# Patient Record
Sex: Male | Born: 2000 | Race: White | Hispanic: No | Marital: Single | Smoking: Never smoker
Health system: Southern US, Community
[De-identification: ages and names within clinical notes are randomized; demographics above are authoritative.]

## PROBLEM LIST (undated history)

## (undated) DIAGNOSIS — G809 Cerebral palsy, unspecified: Secondary | ICD-10-CM

## (undated) DIAGNOSIS — R451 Restlessness and agitation: Secondary | ICD-10-CM

## (undated) DIAGNOSIS — I456 Pre-excitation syndrome: Secondary | ICD-10-CM

## (undated) DIAGNOSIS — F319 Bipolar disorder, unspecified: Secondary | ICD-10-CM

## (undated) HISTORY — PX: TONSILLECTOMY: SUR1361

## (undated) HISTORY — DX: Cerebral palsy, unspecified: G80.9

## (undated) HISTORY — DX: Restlessness and agitation: R45.1

---

## 2021-11-28 ENCOUNTER — Emergency Department (HOSPITAL_COMMUNITY)
Admission: EM | Admit: 2021-11-28 | Discharge: 2021-12-02 | Disposition: A | Discharge status: Home / Self Care | Payer: Medicaid Other | Attending: Emergency Medicine | Admitting: Emergency Medicine

## 2021-11-28 ENCOUNTER — Other Ambulatory Visit: Payer: Self-pay

## 2021-11-28 ENCOUNTER — Encounter (HOSPITAL_COMMUNITY): Payer: Self-pay | Admitting: Emergency Medicine

## 2021-11-28 DIAGNOSIS — R451 Restlessness and agitation: Secondary | ICD-10-CM

## 2021-11-28 DIAGNOSIS — J320 Chronic maxillary sinusitis: Secondary | ICD-10-CM | POA: Insufficient documentation

## 2021-11-28 DIAGNOSIS — J323 Chronic sphenoidal sinusitis: Secondary | ICD-10-CM | POA: Insufficient documentation

## 2021-11-28 DIAGNOSIS — F3113 Bipolar disorder, current episode manic without psychotic features, severe: Secondary | ICD-10-CM | POA: Diagnosis not present

## 2021-11-28 DIAGNOSIS — F3112 Bipolar disorder, current episode manic without psychotic features, moderate: Secondary | ICD-10-CM

## 2021-11-28 DIAGNOSIS — J322 Chronic ethmoidal sinusitis: Secondary | ICD-10-CM | POA: Insufficient documentation

## 2021-11-28 DIAGNOSIS — Z20822 Contact with and (suspected) exposure to covid-19: Secondary | ICD-10-CM | POA: Insufficient documentation

## 2021-11-28 DIAGNOSIS — Z79899 Other long term (current) drug therapy: Secondary | ICD-10-CM | POA: Insufficient documentation

## 2021-11-28 DIAGNOSIS — R441 Visual hallucinations: Secondary | ICD-10-CM | POA: Diagnosis present

## 2021-11-28 DIAGNOSIS — R4182 Altered mental status, unspecified: Secondary | ICD-10-CM | POA: Insufficient documentation

## 2021-11-28 DIAGNOSIS — F319 Bipolar disorder, unspecified: Secondary | ICD-10-CM

## 2021-11-28 HISTORY — DX: Bipolar disorder, unspecified: F31.9

## 2021-11-28 LAB — COMPREHENSIVE METABOLIC PANEL
ALT: 18 U/L (ref 0–44)
AST: 18 U/L (ref 15–41)
Albumin: 4.5 g/dL (ref 3.5–5.0)
Alkaline Phosphatase: 51 U/L (ref 38–126)
Anion gap: 12 (ref 5–15)
BUN: 12 mg/dL (ref 6–20)
CO2: 23 mmol/L (ref 22–32)
Calcium: 9.3 mg/dL (ref 8.9–10.3)
Chloride: 105 mmol/L (ref 98–111)
Creatinine, Ser: 0.83 mg/dL (ref 0.61–1.24)
GFR, Estimated: >60 mL/min (ref >60)
Glucose, Bld: 106 mg/dL — ABNORMAL HIGH (ref 70–99)
Potassium: 3.5 mmol/L (ref 3.5–5.1)
Sodium: 140 mmol/L (ref 135–145)
Total Bilirubin: 0.6 mg/dL (ref 0.3–1.2)
Total Protein: 7.7 g/dL (ref 6.5–8.1)

## 2021-11-28 LAB — CBC
HCT: 42.6 % (ref 39.0–52.0)
Hemoglobin: 13.7 g/dL (ref 13.0–17.0)
MCH: 29.5 pg (ref 26.0–34.0)
MCHC: 32.2 g/dL (ref 30.0–36.0)
MCV: 91.8 fL (ref 80.0–100.0)
Platelets: 246 10*3/uL (ref 150–400)
RBC: 4.64 MIL/uL (ref 4.22–5.81)
RDW: 11.7 % (ref 11.5–15.5)
WBC: 6.5 10*3/uL (ref 4.0–10.5)
nRBC: 0 % (ref 0.0–0.2)

## 2021-11-28 LAB — ACETAMINOPHEN LEVEL: Acetaminophen (Tylenol), Serum: <10 ug/mL — ABNORMAL LOW (ref 10–30)

## 2021-11-28 LAB — SALICYLATE LEVEL: Salicylate Lvl: <7 mg/dL — ABNORMAL LOW (ref 7.0–30.0)

## 2021-11-28 LAB — ETHANOL: Alcohol, Ethyl (B): <10 mg/dL (ref <10)

## 2021-11-28 MED ORDER — LORAZEPAM 1 MG PO TABS
1.0000 mg | ORAL_TABLET | ORAL | Status: AC | PRN
  Administered 2021-11-28: 1 mg via ORAL
  Filled 2021-11-28: qty 1

## 2021-11-28 MED ORDER — HALOPERIDOL 5 MG PO TABS: 10.0000 mg | ORAL_TABLET | Freq: Once | ORAL | Status: DC

## 2021-11-28 MED ORDER — ZIPRASIDONE HCL 20 MG PO CAPS
20.0000 mg | ORAL_CAPSULE | Freq: Once | ORAL | Status: AC
  Administered 2021-11-28: 20 mg via ORAL
  Filled 2021-11-28: qty 1

## 2021-11-28 MED ORDER — RISPERIDONE 1 MG PO TBDP
2.0000 mg | ORAL_TABLET | Freq: Three times a day (TID) | ORAL | Status: DC | PRN
  Administered 2021-11-30 – 2021-12-02 (×2): 2 mg via ORAL
  Filled 2021-11-28 (×4): qty 2

## 2021-11-28 MED ORDER — ZIPRASIDONE MESYLATE 20 MG IM SOLR: 20.0000 mg | INTRAMUSCULAR | Status: DC | PRN

## 2021-11-28 MED ORDER — LORAZEPAM 2 MG/ML IJ SOLN
1.0000 mg | Freq: Once | INTRAMUSCULAR | Status: AC
  Administered 2021-11-28: 1 mg via INTRAMUSCULAR
  Filled 2021-11-28: qty 1

## 2021-11-28 MED ORDER — HALOPERIDOL LACTATE 5 MG/ML IJ SOLN: 10.0000 mg | Freq: Once | INTRAMUSCULAR | Status: DC

## 2021-11-28 NOTE — ED Notes (Signed)
Pt ambulated to bathroom by mother and family friend. Pt attempted to provide urine specimen, but was unable to urinate at this time.

## 2021-11-28 NOTE — BH Assessment (Signed)
Received TTS consult. Spoke with RN who will contact TTS when interpreter and tele-cart are ready.   Pamalee Leyden, [facility], [facility] Triage Specialist [phone]
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
## 2021-11-28 NOTE — ED Notes (Signed)
Difficult to obtain VS on pt d/t behavior

## 2021-11-28 NOTE — ED Notes (Addendum)
Pt is a refugee from [place] and has been here about a month. 2 days ago, his psychiatrist took pt off of all of his meds per family. Pt has not slept in 3 days. 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 Pt has intermittent loud speech w/ flight of ideas, labile from happy to sad. Pt has auditory and visual hallucinations per family. Voices talk to him in [place] and Arabic per family. Pt w/ intermittent outbursts of activity, almost jumping off of bed. Family very supportive and able to help redirect pt.
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
## 2021-11-28 NOTE — BH Assessment (Addendum)
Comprehensive Clinical Assessment (CCA) Note  11/28/2021 Allyn Weinfeld PZ:[id number]  DISPOSITION: Gave clinical report to Quintella Reichert, NP who recommended Pt be observed overnight and evaluated by psychiatry in the morning. She also recommended Pt be given Seroquel 150 mg tonight and recommends a Depakote level and a neurology consult. Notified Dr. Carmin Muskrat and Newman Pies, RN of recommendations via secure message.  The patient demonstrates the following risk factors for suicide: Chronic risk factors for suicide include: psychiatric disorder of bipolar disorder . Acute risk factors for suicide include:  immigration to [place] from [place] . Protective factors for this patient include: positive social support and positive therapeutic relationship. Considering these factors, the overall suicide risk at this point appears to be low. Patient is appropriate for outpatient follow up.  [place] ED from 11/28/2021 in [place] No Risk      Pt is a 20 year old single male who presents to [facility] accompanied by his parents, who participated in assessment. Pt and family speak Arabic and assessment was conducted with online translation service. 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 Pt was unable to appropriately answer most questions and groaned and yelled during assessment. He was being physically supported by an Therapist, sports because Pt wanted to flail his arms and legs. Obtaining information from Pt directly was very difficult due to Pt's mental status, behavior, and language barrier.  Pt's mother reports Pt was stable for years and three years ago he had mood lability and was diagnosed with bipolar disorder. She describes recent mood cycling where Pt will be depressed and lethargic for approximately two weeks then manic for  approximately two weeks. She says he is currently having a psychotic episode, the worst he has experienced. She and Pt's father report Pt has been agitated, loud, swinging his arms and legs, experiencing flight of ideas, saying things that do not make sense, and appears to be experiencing auditory and visual hallucinations. Per parents, he hears voices talking to him in [place] and Arabic. Mother says Pt has not slept in three days. Parents report he does not have self-harm behaviors. They confirm there is no possibility of alcohol or drug use.  Pt and family immigrated from [place] to [place], [place] on 10/21/2021. They are living in a residence with two other families. Pt was receiving outpatient psychiatric medication management in [place]. He saw a psychiatrist in [place], Dr. Arnold Long, two days ago. Mother reports Dr. Rolena Infante changed two of Pt's medications and kept Pt on the other medications. She says medications Pt is currently taking are: Enderol, Ludasidone, Quetiazic, Depakine, and Aripiprazole. She says he does swallow the medications. Parents report he has not history of inpatient psychiatric treatment.  Pt's parents state they will agree to Pt being held overnight so he can sleep but they do not want him psychiatrically hospitalized. They say they need to be with Pt to assist him. Parents say they would rather take Pt home and give him medication than have him admitted to a psychiatric facility.  Father: Maylon Cos [phone]    Chief Complaint:  Chief Complaint  Patient presents with   Manic Behavior   Visit Diagnosis:  F31.13 Bipolar I disorder, Current or most recent episode manic, Severe   CCA Screening, Triage and Referral (STR)  Patient Reported Information How did you hear about [place]? Family/Friend  Referral name: No data recorded Referral phone number: No data recorded  Whom do you see for routine medical problems? No data recorded Practice/Facility Name: No data  recorded Practice/Facility Phone Number: No data recorded Name of Contact: No data recorded Contact Number: No data recorded Contact Fax Number: No data recorded Prescriber Name: No data recorded Prescriber Address (if known): No data recorded  What Is the Reason for Your Visit/Call Today? Per parents, Pt has a history of oxygen deficiency as an infant, brain atrophy, and bipolar disorder. She says he is having a severe manic episode with flight of ideas, insomnia, agitation, and talking to people who are not there. Pt is unable to answer most questions appropriately.  How Long Has This Been Causing You Problems? 1 wk - 1 month  What Do You Feel Would Help You the Most Today? Medication(s)   Have You Recently Been in Any Inpatient Treatment (Hospital/Detox/Crisis Center/28-Day Program)? No data recorded Name/Location of Program/Hospital:No data recorded How Long Were You There? No data recorded When Were You Discharged? No data recorded  Have You Ever Received Services From [facility] Before? No data recorded Who Do You See at [facility]? No data recorded  Have You Recently Had Any Thoughts About Hurting Yourself? No  Are You Planning to Commit Suicide/Harm Yourself At This time? No   Have you Recently Had Thoughts About [place]? No  Explanation: No data recorded  Have You Used Any Alcohol or Drugs in the Past 24 Hours? No  How Long Ago Did You Use Drugs or Alcohol? No data recorded What Did You Use and How Much? No data recorded  Do You Currently Have a Therapist/Psychiatrist? Yes  Name of Therapist/Psychiatrist: Arnold Long, MD   Have You Been Recently Discharged From Any Office Practice or Programs? No  Explanation of Discharge From Practice/Program: No data recorded    CCA Screening Triage Referral Assessment Type of Contact: Tele-Assessment  Is this Initial or Reassessment? Initial Assessment  Date Telepsych consult ordered in [facility]:   11/28/21  Time Telepsych consult ordered in [facility]:  1934   Patient Reported Information Reviewed? No data recorded Patient Left Without Being Seen? No data recorded Reason for Not Completing Assessment: No data recorded  Collateral Involvement: Pt's mother and father   Does Patient Have a Court Appointed Legal Guardian? No data recorded Name and Contact of Legal Guardian: No data recorded If Minor and Not Living with Parent(s), Who has Custody? NA  Is CPS involved or ever been involved? Never  Is APS involved or ever been involved? Never   Patient Determined To Be At Risk for Harm To Self or Others Based on Review of Patient Reported Information or Presenting Complaint? No  Method: No data recorded Availability of Means: No data recorded Intent: No data recorded Notification Required: No data recorded Additional Information for Danger to Others Potential: No data recorded Additional Comments for Danger to Others Potential: No data recorded Are There Guns or Other Weapons in Your Home? No data recorded Types of Guns/Weapons: No data recorded Are These Weapons Safely Secured?                            No data recorded Who Could Verify You Are Able To Have These Secured: No data recorded Do You Have any Outstanding Charges, Pending Court Dates, Parole/Probation? No data recorded Contacted To Inform of Risk of Harm To Self or Others: Family/Significant Other:  Location of Assessment: [facility] ED   Does Patient Present under Involuntary Commitment? No  IVC Papers Initial File Date: No data recorded  [place] of Residence: [place]   Patient Currently Receiving the Following Services: Medication Management   Determination of Need: Urgent (48 hours)   Options For Referral: Inpatient Hospitalization; Medication Management; Outpatient Therapy     CCA Biopsychosocial Intake/Chief Complaint:  No data recorded Current Symptoms/Problems: No data recorded  Patient Reported  Schizophrenia/Schizoaffective Diagnosis in Past: No   Strengths: Pt has good family support  Preferences: No data recorded Abilities: No data recorded  Type of Services Patient Feels are Needed: No data recorded  Initial Clinical Notes/Concerns: No data recorded  Mental Health Symptoms Depression:   Change in energy/activity; Difficulty Concentrating; Fatigue; Irritability; Sleep (too much or little)   Duration of Depressive symptoms:  Greater than two weeks   Mania:   Change in energy/activity; Increased Energy; Irritability; Racing thoughts; Recklessness   Anxiety:    Difficulty concentrating; Fatigue; Irritability; Restlessness; Sleep; Tension; Worrying   Psychosis:   Hallucinations; Grossly disorganized or catatonic behavior   Duration of Psychotic symptoms:  Less than six months   Trauma:   -- (Pt unable to answer due to current mental status.)   Obsessions:   -- (Pt unable to answer due to current mental status.)   Compulsions:   -- (Pt unable to answer due to current mental status.)   Inattention:   -- (Pt unable to answer due to current mental status.)   Hyperactivity/Impulsivity:   -- (Pt unable to answer due to current mental status.)   Oppositional/Defiant Behaviors:   -- (Pt unable to answer due to current mental status.)   Emotional Irregularity:   Mood lability   Other Mood/Personality Symptoms:   NA    Mental Status Exam Appearance and self-care  Stature:   Average   Weight:   Average weight   Clothing:   Casual   Grooming:   Normal   Cosmetic use:   None   Posture/gait:   Bizarre; Slumped   Motor activity:   Agitated; Restless   Sensorium  Attention:   Confused   Concentration:   Scattered   Orientation:   -- (Pt unable to answer due to current mental status.)   Recall/memory:   -- (Pt unable to answer due to current mental status.)   Affect and Mood  Affect:   Anxious   Mood:   Irritable; Anxious    Relating  Eye contact:   Fleeting   Facial expression:   Tense; Anxious   Attitude toward examiner:   Uninterested   Thought and Language  Speech flow:  Loud (Pt unable to answer due to current mental status.)   Thought content:   -- (Pt unable to answer due to current mental status.)   Preoccupation:   -- (Pt unable to answer due to current mental status.)   Hallucinations:   Other (Comment) (Pt unable to answer due to current mental status.)   Organization:  No data recorded  [employer] of Knowledge:   Impoverished by (Comment) (Cognitive impairment)   Intelligence:   Needs investigation   Abstraction:   -- (Pt unable to answer due to current mental status.)   Judgement:   -- (Pt unable to answer due to current mental status.)   Reality Testing:   -- (Pt unable to answer due to current mental status.)   Insight:   -- (Pt unable to answer due to current  mental status.)   Decision Making:   -- (Pt unable to answer due to current mental status.)   Social Functioning  Social Maturity:   -- (Pt unable to answer due to current mental status.)   Social Judgement:   -- (Pt unable to answer due to current mental status.)   Stress  Stressors:   Transitions; Housing   Coping Ability:   Exhausted; Overwhelmed   Skill Deficits:   Intellect/education   Supports:   Family     Religion: Religion/Spirituality Are You A Religious Person?:  (Pt unable to answer due to current mental status.)  Leisure/Recreation: Leisure / Recreation Do You Have Hobbies?:  (Pt unable to answer due to current mental status.)  Exercise/Diet: Exercise/Diet Do You Exercise?:  (Pt unable to answer due to current mental status.) Have You Gained or Lost A Significant Amount of Weight in the Past Six Months?: Yes-Lost Number of Pounds Lost?:  (Pt unable to answer due to current mental status.) Do You Follow a Special Diet?: No Do You Have Any Trouble Sleeping?:  Yes Explanation of Sleeping Difficulties: Parents report Pt has not slept in 3 days   CCA Employment/Education Employment/Work Situation: Employment / Work Situation Employment Situation: Unemployed Patient's Job has Been Impacted by Current Illness: No Has Patient ever Been in Passenger transport manager?: No  Education: Education Is Patient Currently Attending School?: No Did Physicist, medical?: No Did You Have An Individualized Education Program (IIEP):  (Pt unable to answer due to current mental status.) Did You Have Any Difficulty At [employer]?:  (Pt unable to answer due to current mental status.) Patient's Education Has Been Impacted by Current Illness:  (Pt unable to answer due to current mental status.)   CCA Family/Childhood History Family and Relationship History: Family history Marital status: Single Does patient have children?: No  Childhood History:  Childhood History By whom was/is the patient raised?: Both parents Did patient suffer any verbal/emotional/physical/sexual abuse as a child?:  (Pt unable to answer due to current mental status.) Did patient suffer from severe childhood neglect?:  (Pt unable to answer due to current mental status.) Has patient ever been sexually abused/assaulted/raped as an adolescent or adult?:  (Pt unable to answer due to current mental status.) Was the patient ever a victim of a crime or a disaster?:  (Pt unable to answer due to current mental status.) Witnessed domestic violence?:  (Pt unable to answer due to current mental status.) Has patient been affected by domestic violence as an adult?:  (Pt unable to answer due to current mental status.)  Child/Adolescent Assessment:     CCA Substance Use Alcohol/Drug Use: Alcohol / Drug Use Pain Medications: None Prescriptions: None Over the Counter: None History of alcohol / drug use?: No history of alcohol / drug abuse Longest period of sobriety (when/how long): NA                          ASAM's:  Six Dimensions of Multidimensional Assessment  Dimension 1:  Acute Intoxication and/or Withdrawal Potential:      Dimension 2:  Biomedical Conditions and Complications:      Dimension 3:  Emotional, Behavioral, or Cognitive Conditions and Complications:     Dimension 4:  Readiness to Change:     Dimension 5:  Relapse, Continued use, or Continued Problem Potential:     Dimension 6:  Recovery/Living Environment:     ASAM Severity Score:    ASAM Recommended Level of Treatment:  Substance use Disorder (SUD)    Recommendations for Services/Supports/Treatments:    DSM5 Diagnoses: There are no problems to display for this patient.   Patient Centered Plan: Patient is on the following Treatment Plan(s):  Anxiety   Referrals to Alternative Service(s): Referred to Alternative Service(s):   Place:   Date:   Time:    Referred to Alternative Service(s):   Place:   Date:   Time:    Referred to Alternative Service(s):   Place:   Date:   Time:    Referred to Alternative Service(s):   Place:   Date:   Time:     Evelena Peat, [facility]

## 2021-11-29 ENCOUNTER — Emergency Department (HOSPITAL_COMMUNITY): Payer: Medicaid Other

## 2021-11-29 DIAGNOSIS — F319 Bipolar disorder, unspecified: Secondary | ICD-10-CM

## 2021-11-29 DIAGNOSIS — F3112 Bipolar disorder, current episode manic without psychotic features, moderate: Secondary | ICD-10-CM

## 2021-11-29 LAB — RAPID URINE DRUG SCREEN, HOSP PERFORMED
Amphetamines: NOT DETECTED
Barbiturates: NOT DETECTED
Benzodiazepines: NOT DETECTED
Cocaine: NOT DETECTED
Opiates: NOT DETECTED
Tetrahydrocannabinol: NOT DETECTED

## 2021-11-29 LAB — RESP PANEL BY RT-PCR (FLU A&B, COVID) ARPGX2
Influenza A by PCR: NEGATIVE
Influenza B by PCR: NEGATIVE
SARS Coronavirus 2 by RT PCR: NEGATIVE

## 2021-11-29 LAB — VALPROIC ACID LEVEL: Valproic Acid Lvl: 81 ug/mL (ref 50.0–100.0)

## 2021-11-29 MED ORDER — DIVALPROEX SODIUM ER 500 MG PO TB24
1000.0000 mg | ORAL_TABLET | Freq: Every day | ORAL | Status: DC
  Filled 2021-11-29: qty 2

## 2021-11-29 MED ORDER — QUETIAPINE FUMARATE 50 MG PO TABS
150.0000 mg | ORAL_TABLET | Freq: Once | ORAL | Status: DC
  Filled 2021-11-29: qty 3

## 2021-11-29 MED ORDER — LORAZEPAM 1 MG PO TABS
1.0000 mg | ORAL_TABLET | Freq: Once | ORAL | Status: AC
  Administered 2021-11-29: 11:00:00 1 mg via ORAL
  Filled 2021-11-29: qty 1

## 2021-11-29 MED ORDER — ARIPIPRAZOLE 10 MG PO TABS
15.0000 mg | ORAL_TABLET | Freq: Every day | ORAL | Status: DC
  Administered 2021-11-29 – 2021-12-02 (×4): 15 mg via ORAL
  Filled 2021-11-29 (×2): qty 1
  Filled 2021-11-29 (×2): qty 2

## 2021-11-29 MED ORDER — DIVALPROEX SODIUM ER 500 MG PO TB24
1000.0000 mg | ORAL_TABLET | Freq: Every day | ORAL | Status: DC
  Administered 2021-11-29 – 2021-12-01 (×3): 1000 mg via ORAL
  Filled 2021-11-29 (×4): qty 2

## 2021-11-29 MED ORDER — LORAZEPAM 1 MG PO TABS
1.0000 mg | ORAL_TABLET | Freq: Once | ORAL | Status: AC
  Administered 2021-11-29: 10:00:00 1 mg via ORAL
  Filled 2021-11-29: qty 1

## 2021-11-29 MED ORDER — ARIPIPRAZOLE 5 MG PO TABS: 15.0000 mg | ORAL_TABLET | Freq: Every day | ORAL | Status: DC

## 2021-11-29 MED ORDER — QUETIAPINE FUMARATE 50 MG PO TABS
150.0000 mg | ORAL_TABLET | Freq: Every day | ORAL | Status: DC
  Administered 2021-11-29 – 2021-12-01 (×3): 150 mg via ORAL
  Filled 2021-11-29 (×5): qty 1

## 2021-11-29 NOTE — ED Notes (Signed)
BH team secure messaged to establish plan/schedule for reassessment/ TTS.

## 2021-11-29 NOTE — ED Notes (Signed)
Pt ambulatory in h/w with mother, back to room and stretcher. Given A.Juice per request. Mother updated.

## 2021-11-29 NOTE — ED Notes (Signed)
Pt has been sleeping. Family at [facility]. Pt now awake, alert, NAD, semi: calm, cooperative. Also, increased/ing activity, and restlessness.  Sitting forward on bed, leaning forward towards floor, repositioned without resistance, but no help from pt (by RN x2, and family). Family updated with plan.

## 2021-11-29 NOTE — ED Notes (Signed)
Family requesting pt be placed back on medicine he was previously on before current MD removed them . Also requesting resources for outpatient follow up care and MDs

## 2021-11-29 NOTE — ED Notes (Signed)
TTS complete 

## 2021-11-29 NOTE — ED Notes (Signed)
Mother at [facility] updated. CT notified pt ready. Pt sleeping/sedated. VSS.

## 2021-11-29 NOTE — ED Notes (Addendum)
Initiating TTS with video TTS and video interpreter Asmaa #[id number]. Pt present, but sleepy. Limited concentration. Mother present. Some difficulty with interchanges. Pt moaning, and agitated with process. Meds reviewed: (Rx from [place], and [place]) Depakote, abilify, seroquel, Biperiden

## 2021-11-29 NOTE — ED Notes (Signed)
Pt agravated by arm band/ biting arm band. Armband/ID removed from wrist and placed on ankle.

## 2021-11-29 NOTE — ED Notes (Signed)
Pt eating. Meds given. Brother at [facility].

## 2021-11-29 NOTE — Consult Note (Signed)
Telepsych Consultation   Reason for Consult:  Psychiatric Reassessment Referring Physician:  Dr. Jeraldine Loots Location of Patient:    [facility] Location of Provider: Other: virtual home office  Patient Identification: Phillip [name] MRN:  [id number] Principal Diagnosis: Bipolar 1 disorder with moderate mania (HCC) Diagnosis:  Principal Problem:   Bipolar 1 disorder with moderate mania (HCC) Active Problems:   Bipolar 1 disorder (HCC)   Total Time spent with patient: 30 minutes  Subjective:   Phillip [name] is a 20 y.o. male patient admitted with manic behavior, seroquel was restarted and pt recommended for overnight stay and AM psych reassessment.   Patient seen via telepsych by this provider; chart reviewed and consulted with Dr. Lucianne Muss on 11/29/21.  On evaluation Phillip [name] is laying in bed, facing away from camera, his mother is present.  As patient was recently given prn ativan, he appears restless, somewhat irritable, moaning and moving around in bed but no acute distress and he responds to verbal redirection from his mother.    Patient/mother speaks arabic, video interpreter Asmaa [id number] used for assessment. Mother collaborates most of what was previously obtained in prior [facility] assessments.  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 Pt who was at baseline taking Depakote, Seroquel, Aripiprazole and Biperiden.  Ran out of Seroquel 1 week ago and began having problems with sleep, mania. Mom states pt does not have a history for self harm or harming others.    Since hospitalization he was restarted on seroquel 150mg  po qhs and given prn ativan for agitation.  He been med compliant and has not had aggressive episodes nor has he required physical restraints.  Per nursing he sleep about 5-6 hours last night; At baseline he is interactive with family and able to  communicate his needs.  Patient's mother has safety concerns as she believes he continues to respond to internal stimulus and is not appropriately interacting today.  States there is some improvement but feels he is not at baseline.  She would like patient to remain overnight and restart his other home meds, Depakote and Aripiprazole. Reassurance provided to patient and family.   He is medically cleared, completed head CT which did not yield any abnormalities; Valporic Acid levels 81-WNL; LFTs and electrolytes all WNL as well.  Negative toxicology; covid and flu screenings.  HPI:  Per MD Admission Assessment 11/28/2021: Chief Complaint  Patient presents with   Manic Behavior      Phillip [name] is a 20 y.o. male.   HPI Patient presents with his mother and a translator who speaks Arabic.  Is a 20 year old male with what is thought to be bipolar disorder, level 5 caveat secondary to psychiatric condition. Seemingly the patient immigrated here about 1 month ago.  Reported the patient's physician advised family to stop providing his antipsychotic medication regimen.  He is currently only on 2 medications, whereas he was previously on 6.  According to family with past 3 days patient has had been increasingly agitated, with labile behavior, seemingly interacting with visual hallucination. No reported fall, trauma, actual violence against his mother.    Past Psychiatric History: as outlined below  Risk to Self:  yes, d/t impaired sleep Risk to Others:  no Prior Inpatient Therapy:  yes Prior Outpatient Therapy:  yes  Past Medical History:  Past Medical History:  Diagnosis Date   Bipolar  1 disorder (HCC)    History reviewed. No pertinent surgical history. Family History: No family history on file. Family Psychiatric  History: unknown Social History:  Social History   Substance and Sexual Activity  Alcohol Use Not Currently     Social History   Substance and Sexual Activity  Drug Use Not  Currently    Social History   Socioeconomic History   Marital status: Single    Spouse name: Not on file   Number of children: Not on file   Years of education: Not on file   Highest education level: Not on file  Occupational History   Not on file  Tobacco Use   Smoking status: Never   Smokeless tobacco: Never  Substance and Sexual Activity   Alcohol use: Not Currently   Drug use: Not Currently   Sexual activity: Not on file  Other Topics Concern   Not on file  Social History Narrative   Not on file   Social Determinants of Health   Financial Resource Strain: Not on file  Food Insecurity: Not on file  Transportation Needs: Not on file  Physical Activity: Not on file  Stress: Not on file  Social Connections: Not on file   Additional Social History:    Allergies:  No Known Allergies  Labs:  Results for orders placed or performed during the hospital encounter of 11/28/21 (from the past 48 hour(s))  Rapid urine drug screen (hospital performed)     Status: None   Collection Time: 11/28/21 12:48 AM  Result Value Ref Range   Opiates NONE DETECTED NONE DETECTED   Cocaine NONE DETECTED NONE DETECTED   Benzodiazepines NONE DETECTED NONE DETECTED   Amphetamines NONE DETECTED NONE DETECTED   Tetrahydrocannabinol NONE DETECTED NONE DETECTED   Barbiturates NONE DETECTED NONE DETECTED    Comment: (NOTE) DRUG SCREEN FOR MEDICAL PURPOSES ONLY.  IF CONFIRMATION IS NEEDED FOR ANY PURPOSE, NOTIFY LAB WITHIN 5 DAYS.  LOWEST DETECTABLE LIMITS FOR URINE DRUG SCREEN Drug Class                     Cutoff (ng/mL) Amphetamine and metabolites    1000 Barbiturate and metabolites    200 Benzodiazepine                 200 Tricyclics and metabolites     300 Opiates and metabolites        300 Cocaine and metabolites        300 THC                            50 Performed at [facility], [address] [place], [place] [address]   Comprehensive metabolic panel     Status:  Abnormal   Collection Time: 11/28/21  1:23 PM  Result Value Ref Range   Sodium 140 135 - 145 mmol/L   Potassium 3.5 3.5 - 5.1 mmol/L   Chloride 105 98 - 111 mmol/L   CO2 23 22 - 32 mmol/L   Glucose, Bld 106 (H) 70 - 99 mg/dL    Comment: Glucose reference range applies only to samples taken after fasting for at least 8 hours.   BUN 12 6 - 20 mg/dL   Creatinine, Ser 1.91 0.61 - 1.24 mg/dL   Calcium 9.3 8.9 - 47.8 mg/dL   Total Protein 7.7 6.5 - 8.1 g/dL   Albumin 4.5 3.5 - 5.0 g/dL  AST 18 15 - 41 U/L   ALT 18 0 - 44 U/L   Alkaline Phosphatase 51 38 - 126 U/L   Total Bilirubin 0.6 0.3 - 1.2 mg/dL   GFR, Estimated >80 >99 mL/min    Comment: (NOTE) Calculated using the CKD-EPI Creatinine Equation (2021)    Anion gap 12 5 - 15    Comment: Performed at [facility], [address] [place], [place] [address]  Ethanol     Status: None   Collection Time: 11/28/21  1:23 PM  Result Value Ref Range   Alcohol, Ethyl (B) <10 <10 mg/dL    Comment: (NOTE) Lowest detectable limit for serum alcohol is 10 mg/dL.  For medical purposes only. Performed at [facility], [address] [place], [place] [address]   Salicylate level     Status: Abnormal   Collection Time: 11/28/21  1:23 PM  Result Value Ref Range   Salicylate Lvl <7.0 (L) 7.0 - 30.0 mg/dL    Comment: Performed at [facility] Lab, [address] [place], [place] [address]  Acetaminophen level     Status: Abnormal   Collection Time: 11/28/21  1:23 PM  Result Value Ref Range   Acetaminophen (Tylenol), Serum <10 (L) 10 - 30 ug/mL    Comment: (NOTE) Therapeutic concentrations vary significantly. A range of 10-30 ug/mL  may be an effective concentration for many patients. However, some  are best treated at concentrations outside of this range. Acetaminophen concentrations >150 ug/mL at 4 hours after ingestion  and >50 ug/mL at 12 hours after ingestion are often associated with  toxic reactions.  Performed at  [facility], [address] [place], [place] [address]   cbc     Status: None   Collection Time: 11/28/21  1:23 PM  Result Value Ref Range   WBC 6.5 4.0 - 10.5 K/uL   RBC 4.64 4.22 - 5.81 MIL/uL   Hemoglobin 13.7 13.0 - 17.0 g/dL   HCT 02.4 09.7 - 35.3 %   MCV 91.8 80.0 - 100.0 fL   MCH 29.5 26.0 - 34.0 pg   MCHC 32.2 30.0 - 36.0 g/dL   RDW 29.9 24.2 - 68.3 %   Platelets 246 150 - 400 K/uL   nRBC 0.0 0.0 - 0.2 %    Comment: Performed at [facility] Lab, [address], [place], [place] [address]  Valproic acid level     Status: None   Collection Time: 11/28/21  1:23 PM  Result Value Ref Range   Valproic Acid Lvl 81 50.0 - 100.0 ug/mL    Comment: Performed at [facility] Lab, [address] [place], [place] [address]  Resp Panel by RT-PCR (Flu A&B, Covid) Nasopharyngeal Swab     Status: None   Collection Time: 11/28/21 11:14 PM   Specimen: Nasopharyngeal Swab; Nasopharyngeal(NP) swabs in vial transport medium  Result Value Ref Range   SARS Coronavirus 2 by RT PCR NEGATIVE NEGATIVE    Comment: (NOTE) SARS-CoV-2 target nucleic acids are NOT DETECTED.  The SARS-CoV-2 RNA is generally detectable in upper respiratory specimens during the acute phase of infection. The lowest concentration of SARS-CoV-2 viral copies this assay can detect is 138 copies/mL. A negative result does not preclude SARS-Cov-2 infection and should not be used as the sole basis for treatment or other patient management decisions. A negative result may occur with  improper specimen collection/handling, submission of specimen other than nasopharyngeal swab, presence of  viral mutation(s) within the areas targeted by this assay, and inadequate number of viral copies(<138 copies/mL). A negative result must be combined with clinical observations, patient history, and epidemiological information. The expected result is Negative.  Fact Sheet for Patients:   BloggerCourse.com  Fact Sheet for Healthcare Providers:  SeriousBroker.it  This test is no t yet approved or cleared by the [place] FDA and  has been authorized for detection and/or diagnosis of SARS-CoV-2 by FDA under an Emergency Use Authorization (EUA). This EUA will remain  in effect (meaning this test can be used) for the duration of the COVID-19 declaration under Section 564(b)(1) of the Act, 21 U.S.C.section 360bbb-3(b)(1), unless the authorization is terminated  or revoked sooner.       Influenza A by PCR NEGATIVE NEGATIVE   Influenza B by PCR NEGATIVE NEGATIVE    Comment: (NOTE) The Xpert Xpress SARS-CoV-2/FLU/RSV plus assay is intended as an aid in the diagnosis of influenza from Nasopharyngeal swab specimens and should not be used as a sole basis for treatment. Nasal washings and aspirates are unacceptable for Xpert Xpress SARS-CoV-2/FLU/RSV testing.  Fact Sheet for Patients: BloggerCourse.com  Fact Sheet for Healthcare Providers: SeriousBroker.it  This test is not yet approved or cleared by the [place] FDA and has been authorized for detection and/or diagnosis of SARS-CoV-2 by FDA under an Emergency Use Authorization (EUA). This EUA will remain in effect (meaning this test can be used) for the duration of the COVID-19 declaration under Section 564(b)(1) of the Act, 21 U.S.C. section 360bbb-3(b)(1), unless the authorization is terminated or revoked.  Performed at [facility] Lab, [address] [place], [place] [address]     Medications:  Current Facility-Administered Medications  Medication Dose Route Frequency Provider Last Rate Last Admin   divalproex (DEPAKOTE ER) 24 hr tablet 1,000 mg  1,000 mg Oral QHS Ophelia Shoulder E, NP       QUEtiapine (SEROQUEL) tablet 150 mg  150 mg Oral Once Cardama, Amadeo Garnet, MD       risperiDONE (RISPERDAL  M-TABS) disintegrating tablet 2 mg  2 mg Oral Q8H PRN Gerhard Munch, MD       And   ziprasidone (GEODON) injection 20 mg  20 mg Intramuscular PRN Gerhard Munch, MD       Current Outpatient Medications  Medication Sig Dispense Refill   ARIPiprazole (ABILIFY) 15 MG tablet Take 15 mg by mouth daily.     divalproex (DEPAKOTE ER) 500 MG 24 hr tablet Take 1,000 mg by mouth at bedtime.      Musculoskeletal: patient laying in bed for entire assessment; info obtained from medical assessment.  Strength & Muscle Tone: within normal limits Gait & Station: normal Patient leans: N/A  Psychiatric Specialty Exam:  Presentation  General Appearance: Other (comment) (patient laying in bed, facing away from camers, answers when called by name)  Eye Contact:No data recorded Speech:Other (comment)  Speech Volume:Decreased  Handedness:Right   Mood and Affect  Mood:Dysphoric  Affect:Congruent; Constricted   Thought Process  Thought Processes:Other (comment)  Descriptions of Associations:Tangential  Orientation:Partial (patient responds when called by name)  Thought Content:Tangential  History of Schizophrenia/Schizoaffective disorder:Yes  Duration of Psychotic Symptoms:Greater than six months  Hallucinations:Hallucinations: Auditory Description of Auditory Hallucinations: unable to describe  Ideas of Reference:None  Suicidal Thoughts:Suicidal Thoughts: No  Homicidal Thoughts:Homicidal Thoughts: No   Sensorium  Memory:-- (unable to determine)  Judgment:Impaired  Insight:Lacking   Executive Functions  Concentration:Poor  Attention Span:Poor  Recall:No data recorded [employer] of  Knowledge:Poor  Language:Fair (interpretor used)   Psychomotor Activity  Psychomotor Activity:Psychomotor Activity: Increased; Restlessness   Assets  Assets:Housing; Social Support   Sleep  Sleep:Sleep: Fair Number of Hours of Sleep: 6    Physical Exam: Physical  Exam Cardiovascular:     Rate and Rhythm: Normal rate.  Pulmonary:     Effort: Pulmonary effort is normal.  Musculoskeletal:        General: Normal range of motion.     Cervical back: Normal range of motion.  Neurological:     Mental Status: He is alert.     Comments: Oriented to name; +negativism    Review of Systems  Constitutional: Negative.   HENT: Negative.    Eyes: Negative.   Respiratory: Negative.    Cardiovascular: Negative.   Gastrointestinal: Negative.   Genitourinary: Negative.   Musculoskeletal: Negative.   Skin: Negative.   Neurological: Negative.   Endo/Heme/Allergies: Negative.   Psychiatric/Behavioral:  Positive for hallucinations.   Blood pressure 111/62, pulse 98, temperature 98.9 F (37.2 C), temperature source Oral, resp. rate 18, SpO2 97 %. There is no height or weight on file to calculate BMI.  Treatment Plan Summary: Recommend overnight observation, where he can be restarted on psychiatric medications, monitored for safety and mood stabilization.  Recommend AM psychiatric evaluation.  If symptomatic improvement, then he can be discharged with plan to follow-up with outpatient psychiatry for med mgmt.  This was discussed with his mother who voices her understanding and denies questions for this Clinical research associate.  Due to his language barrier and baseline intellectual needs, he is not appropriate for transfer to [facility].  Recommend EKG prior to starting meds.  Daily contact with patient to assess and evaluate symptoms and progress in treatment and Medication management.    Continue: Seroquel  po daily for sleep  Start Home Meds: Depakote  ER po qhs for mood Aripiprazole mg po daily for mood   Disposition: Patient does not meet criteria for psychiatric inpatient admission.  This service was provided via telemedicine using a 2-way, interactive audio and video technology.  Names of all persons participating in this telemedicine service and their role in this  encounter. Name: Phillip [name] Role: Patient  Name: Ms. Goldammer Role: Patient's mother  Name: Ophelia Shoulder Role: PMHNP    Chales Abrahams, NP 11/29/2021 3:48 PM

## 2021-11-29 NOTE — ED Notes (Signed)
Mildly increased agitation/restlessness. Repetitively verbal (in his language Arabic). Pt ambulatory with family in hallway for comfort.

## 2021-11-29 NOTE — ED Notes (Signed)
Mother playing cultural music at [facility]. Pt resting reclined. NAD, calmer. Mildly restless.

## 2021-11-29 NOTE — ED Notes (Signed)
Pt to have visitors outside of [facility] visiting hours to help keep pt calm and redirect him.

## 2021-11-29 NOTE — ED Notes (Signed)
Pt's parents went back home to sleep. Pt's brother now at bedside. Pt sleeping comfortably.

## 2021-11-29 NOTE — ED Notes (Signed)
Sleepy, awake, sitting up, remains restless, intermittently/ frequently agitated, calling out. Mother at [facility]. EDPA notified, ativan ordered for CT.

## 2021-11-29 NOTE — ED Provider Notes (Signed)
Emergency Medicine Observation Re-evaluation Note  Zacari Caterino is a 20 y.o. male, seen on rounds today.  Pt initially presented to the ED for complaints of Manic Behavior Currently, the patient is sleeping.  Physical Exam  BP (!) 98/46 (BP Location: Left Arm)   Pulse 72   Temp 98.2 F (36.8 C) (Oral)   Resp 16   SpO2 95%  Physical Exam General: No distress, sleeping Cardiac: Regular rate and rhythm Lungs: No increased work of breathing Psych: Restless when awake, currently sleepy  ED Course / MDM  EKG:   I have reviewed the labs performed to date as well as medications administered while in observation.  Recent changes in the last 24 hours include CT head without notable findings, labs generally unremarkable.  Patient has been started on Seroquel.  Plan  Current plan is for additional behavioral health evaluation, consideration of appropriate medication regimen for safe disposition.  Victorio Severe is not under involuntary commitment.     Gerhard Munch, MD 11/29/21 1322

## 2021-11-29 NOTE — ED Notes (Signed)
Pt restless/ pacing in dark room asking for his brother, while brother switching with mother. Mother now at [facility]. Pending ativan efficacy/ sedation for CT at ~ 10:30.

## 2021-11-30 NOTE — ED Notes (Signed)
Breakfast orders placed 

## 2021-11-30 NOTE — ED Notes (Signed)
Pt sleeping at this time. Family at bedside

## 2021-11-30 NOTE — ED Notes (Signed)
Pt given clean set of scrubs.

## 2021-11-30 NOTE — ED Provider Notes (Signed)
  Physical Exam  BP 127/77 (BP Location: Right Arm)   Pulse 84   Temp 98.8 F (37.1 C) (Oral)   Resp 14   SpO2 100%   Physical Exam  ED Course/Procedures     Procedures  MDM  Reviewing notes it appears that patient has been doing somewhat better.  Had been on medications.  Pending psychiatric reevaluation this morning.       Benjiman Core, MD 11/30/21 [phone]

## 2021-11-30 NOTE — ED Notes (Signed)
Pt walking around room agitated. Pt brother asking for meds.

## 2021-11-30 NOTE — ED Notes (Signed)
Pt belongings at desk in green due to no lockers in purple being open

## 2021-11-30 NOTE — ED Notes (Signed)
Family notified that the pt will be moved to a separate psychiatric zone when He will be watched by a 1:1 sitter and will be in a less stimulating environment however the family was not allowed. Pts family member stated that I must not have reviewed his case because family could not leave. Family reeducated that he  will be in a safe zone and the pt will be watched continuously and continue with his medications and care. Pts family member continued to refuse to leave and asked to talk to my managed charge nurse notified and went to bedside to discuss the situation with the family. Family continues to refuse. Family notified that he will have to remain in a hallway bed and is unable to roam the hallways since it is a danger to himself and others. All belongings removed from bedside.

## 2021-12-01 NOTE — ED Notes (Signed)
Behavioral Health notified that pt needs eval before potential dispo.

## 2021-12-02 ENCOUNTER — Encounter (HOSPITAL_COMMUNITY): Payer: Self-pay | Admitting: Registered Nurse

## 2021-12-02 DIAGNOSIS — F3112 Bipolar disorder, current episode manic without psychotic features, moderate: Secondary | ICD-10-CM

## 2021-12-02 DIAGNOSIS — R451 Restlessness and agitation: Secondary | ICD-10-CM | POA: Insufficient documentation

## 2021-12-02 MED ORDER — ARIPIPRAZOLE 15 MG PO TABS: 15.0000 mg | ORAL_TABLET | Freq: Every day | ORAL | 0 refills | Status: DC

## 2021-12-02 MED ORDER — BENZTROPINE MESYLATE 1 MG PO TABS: 0.5000 mg | ORAL_TABLET | Freq: Two times a day (BID) | ORAL | Status: DC

## 2021-12-02 MED ORDER — DIVALPROEX SODIUM ER 500 MG PO TB24
1000.0000 mg | ORAL_TABLET | Freq: Every day | ORAL | 0 refills | Status: DC
Start: 2021-12-02 — End: 2023-04-01

## 2021-12-02 MED ORDER — QUETIAPINE FUMARATE 50 MG PO TABS: 150.0000 mg | ORAL_TABLET | Freq: Every day | ORAL | 0 refills | Status: DC

## 2021-12-02 MED ORDER — BENZTROPINE MESYLATE 0.5 MG PO TABS: 0.5000 mg | ORAL_TABLET | Freq: Two times a day (BID) | ORAL | 0 refills | Status: DC

## 2021-12-02 NOTE — ED Notes (Signed)
Pt continues to sleep soundly.

## 2021-12-02 NOTE — ED Provider Notes (Signed)
Emergency Medicine Observation Re-evaluation Note  Davidmichael Coffin is a 20 y.o. male, seen on rounds today.  Pt initially presented to the ED for complaints of Manic Behavior Currently, the patient is sleeping.  Physical Exam  BP 112/76 (BP Location: Left Arm)   Pulse 86   Temp 98.2 F (36.8 C) (Oral)   Resp 16   Ht 5' 4.17" (1.63 m)   Wt 65 kg   SpO2 99%   BMI 24.46 kg/m  Physical Exam General: Calm, sleeping Cardiac: Extremities well perfused Lungs: Breathing is even and unlabored Psych: Deferred  ED Course / MDM  EKG:   I have reviewed the labs performed to date as well as medications administered while in observation.  Recent changes in the last 24 hours include: None.  Patient initially arrived in the ED 4 days ago for mania.  The following day, he was evaluated by psychiatry and they recommended overnight observation to restart medications.  At that time, patient did not meet criteria for psychiatric inpatient admission.  He is reportedly waiting on a psychiatric reassessment today.  His family member remains at bedside.  Plan  Current plan is for psychiatric reassessment.Corrie Mckusick is not under involuntary commitment.     Gloris Manchester, MD 12/02/21 [phone]

## 2021-12-02 NOTE — ED Notes (Signed)
Pt has been cleared by Psychiatry and release of information faxed to HIM.  Waiting on EDP to enter prescriptions.

## 2021-12-02 NOTE — ED Notes (Signed)
Pt currently calm and cooperative.  Pt able to follow commands will this writer was obtaining vital signs.

## 2021-12-02 NOTE — ED Notes (Signed)
Pt noted to be sleeping soundly.  Family at bedside.  Will wait to update vitals when Pt wakes d/t lack of sleeping throughout the night.

## 2021-12-02 NOTE — Discharge Instructions (Addendum)
For your behavioral health needs you are advised to follow up with an outpatient provider.  Contact one of the following providers at your earliest opportunity to schedule an intake appointment:       Thedore Mins, MD      [facility]      [address], [address]      [place], [place] [address]      [phone]        [facility]      [address] [place], [place] [address]      [phone]      As a new patient you may need to go during walk-in hours.  Call them before going to inquire.

## 2021-12-02 NOTE — ED Notes (Signed)
RN went to assess patient and to inform patient family about plan of care using interpreter [phone] in Arabic.

## 2021-12-02 NOTE — ED Notes (Signed)
Family member stated he would like me to wait till later to take vitals since the patient just fell asleep after not being able to sleep all night. Will make next shift aware

## 2021-12-02 NOTE — ED Notes (Signed)
Breakfast orders placed 

## 2021-12-02 NOTE — Consult Note (Signed)
Telepsych Consultation   Reason for Consult:  Psychosis Referring Physician:  Mallie Darting, NP Location of Patient: [facility] ED Location of Provider: Other: [facility]  Patient Identification: Phillip [name] MRN:  [id number] Principal Diagnosis: Bipolar 1 disorder with moderate mania ([place]) Diagnosis:  Principal Problem:   Bipolar 1 disorder with moderate mania ([place]) Active Problems:   Bipolar 1 disorder ([place])   Total Time spent with patient: 30 minutes  Subjective:   Phillip [name] is a 20 y.o. male patient admitted to [facility] ED after presenting complaints of manic behavior after being off of medications and needing to have medications restarted  HPI:  Phillip [name], 20 y.o., male patient seen via tele health by this provider, consulted with Dr. Hampton Abbot; and chart reviewed on 12/02/21.  On evaluation Phillip [name] doesn't speak English; an interpretor was used during assessment.  Patient stats he came to the hospital because he was sick.  Patient states he hears voices sometimes but not right now.  States the voices tell him to "run, jump, sit, stuff like that."  Patient denies suicidal/homicidal ideation at this time.  States he has had thoughts before but no intent or plan and no prior suicide attempt.  Patient has had one psychiatric hospitalization in 2020.  Patient also states he has paranoia at times feel as others can read his mind.  Denies at this time.  Patient reports that he lives with his brother and parents who are supportive.  States he finished high school, no college, and is currently unemployed.  Patient has no outpatient psychiatric services at this time but family has spoken to Dr. Marquis Buggy office and once patient has been discharged from hospital can set up an appointment.  Patient reporting tolerating medications without adverse reaction and has slept better while in the hospital and restarting his medications.   During evaluation Phillip [name] is sitting on bed with legs  crossed [place] style.  At time he is looking at camera and other he bent over with head on bed; or sitting up on his knees.  Patient is restless but doesn't appear to be in any acute distress.  He is alert, oriented x 4, calm and cooperative.  His mood is anxious but euthymic with congruent affect.  He does not appear to be responding to internal/external stimuli or delusional thoughts at this time and states the he is not currently hearing voices or having any paranoid thoughts.  Reports the feelings and voices come and go.  At this time patient denies suicidal/self-harm/homicidal ideation, psychosis, and paranoia.  Patient answered question appropriately.  Collateral Information:  Patient gives permission so to speak with his brother and friend that are at bedside.  Brother states would like to take patient home. States that family is there and would be able to care for him but would need prescriptions for medications.  Reports patient much improved from what he was when first presented to hospital.     Past Psychiatric History: See below  Risk to Self:  Denies Risk to Others:  Denies Prior Inpatient Therapy:  Yes Prior Outpatient Therapy:  Yes  Past Medical History:  Past Medical History:  Diagnosis Date   Bipolar 1 disorder ([place])    History reviewed. No pertinent surgical history. Family History: History reviewed. No pertinent family history. Family Psychiatric  History: None reported Social History:  Social History   Substance and Sexual Activity  Alcohol Use Not Currently     Social History   Substance and  Sexual Activity  Drug Use Not Currently    Social History   Socioeconomic History   Marital status: Single    Spouse name: Not on file   Number of children: Not on file   Years of education: Not on file   Highest education level: Not on file  Occupational History   Not on file  Tobacco Use   Smoking status: Never   Smokeless tobacco: Never  Substance and Sexual  Activity   Alcohol use: Not Currently   Drug use: Not Currently   Sexual activity: Not on file  Other Topics Concern   Not on file  Social History Narrative   Not on file   Social Determinants of Health   Financial Resource Strain: Not on file  Food Insecurity: Not on file  Transportation Needs: Not on file  Physical Activity: Not on file  Stress: Not on file  Social Connections: Not on file   Additional Social History:    Allergies:  No Known Allergies  Labs: No results found for this or any previous visit (from the past 48 hour(s)).  Medications:  Current Facility-Administered Medications  Medication Dose Route Frequency Provider Last Rate Last Admin   ARIPiprazole (ABILIFY) tablet 15 mg  15 mg Oral Daily Carmin Muskrat, MD   15 mg at 12/02/21 1030   benztropine (COGENTIN) tablet 0.5 mg  0.5 mg Oral BID Jiles Goya B, NP       divalproex (DEPAKOTE ER) 24 hr tablet 1,000 mg  1,000 mg Oral QHS Carmin Muskrat, MD   1,000 mg at 12/01/21 2307   QUEtiapine (SEROQUEL) tablet 150 mg  150 mg Oral QHS Merlyn Lot E, NP   150 mg at 12/01/21 2308   risperiDONE (RISPERDAL M-TABS) disintegrating tablet 2 mg  2 mg Oral Q8H PRN Carmin Muskrat, MD   2 mg at 12/02/21 0458   And   ziprasidone (GEODON) injection 20 mg  20 mg Intramuscular PRN Carmin Muskrat, MD       Current Outpatient Medications  Medication Sig Dispense Refill   ARIPiprazole (ABILIFY) 15 MG tablet Take 15 mg by mouth daily.     divalproex (DEPAKOTE ER) 500 MG 24 hr tablet Take 1,000 mg by mouth at bedtime.      Musculoskeletal: Strength & Muscle Tone: within normal limits Gait & Station: normal Patient leans: N/A   Psychiatric Specialty Exam:  Presentation  General Appearance: Appropriate for Environment  Eye Contact:Fair  Speech:Clear and Coherent; Normal Rate  Speech Volume:Normal  Handedness:Right   Mood and Affect  Mood:Anxious  Affect:Congruent   Thought Process  Thought  Processes:Coherent; Goal Directed  Descriptions of Associations:Intact  Orientation:Full (Time, Place and Person)  Thought Content:Logical  History of Schizophrenia/Schizoaffective disorder:No  Duration of Psychotic Symptoms:N/A  Hallucinations:Hallucinations: Auditory Description of Auditory Hallucinations: Hearing voices telling him to run, sit. jump"  Ideas of Reference:Paranoia (States feel like someone can read his thoughts sometimes)  Suicidal Thoughts:Suicidal Thoughts: No  Homicidal Thoughts:Homicidal Thoughts: No   Sensorium  Memory:Immediate Fair; Recent [place]  Insight:Fair   Executive Functions  Concentration:Fair  Attention Span:Fair  [place] Pharmacist, hospital used)   Psychomotor Activity  Psychomotor Activity:Psychomotor Activity: Restlessness   Assets  Assets:Communication Skills; Desire for Improvement; Leisure Time; Social Support   Sleep  Sleep:Sleep: Good (Good since he has been in hospital)    Physical Exam: Physical Exam Vitals and nursing note reviewed. Exam conducted with a chaperone present.  Constitutional:  General: He is not in acute distress.    Appearance: Normal appearance. He is not ill-appearing.  Cardiovascular:     Rate and Rhythm: Normal rate.  Pulmonary:     Effort: Pulmonary effort is normal.  Neurological:     Mental Status: He is alert and oriented to person, place, and time.  Psychiatric:        Attention and Perception: He perceives auditory (Improved) hallucinations.        Mood and Affect: Mood and affect normal.        Speech: Speech normal.        Behavior: Hyperactive: Restless. Behavior is cooperative.        Thought Content: Thought content is paranoid (Improvedf). Thought content is not delusional. Thought content does not include homicidal or suicidal ideation.        Cognition and Memory: Cognition normal.        Judgment: Judgment is  impulsive.   Review of Systems  Constitutional: Negative.   HENT: Negative.    Eyes: Negative.   Respiratory: Negative.    Cardiovascular: Negative.   Gastrointestinal: Negative.   Genitourinary: Negative.   Musculoskeletal: Negative.   Skin: Negative.   Neurological: Negative.   Endo/Heme/Allergies: Negative.   Psychiatric/Behavioral:  Depression: Stable. Hallucinations: Reports he is hearing voices but not at current time.. Substance abuse: Denies. Suicidal ideas: Denies at this time. Nervous/anxious: Stable. Insomnia: Has slept better in hospital since getting back on medications.        Patient reporting feeling better but doesn't want a lot of stimulation.  States felt worse when he was taken out of room into hallway around to many people  Blood pressure 129/75, pulse 90, temperature 98.6 F (37 C), temperature source Oral, resp. rate 16, height 5' 4.17" (1.63 m), weight 65 kg, SpO2 100 %. Body mass index is 24.46 kg/m.   Treatment Plan Summary: Plan Psychiatrically clear to follow up with outpatient psychiatric services  Family reporting much improvement since starting medications and doesn't want patient admitted to psychiatric hospital; feel they can manage better at home and patient reporting he feels he is ready to go home.  At current time patient denying suicidal/homicidal ideation, psychosis, and paranoia.  Family in process of trying to get outpatient psychiatric services set up.  Nursing/Social work with get consent for release of information and send summary of stay to Dr. Gloris Manchester office and set up an appointment.   Discussed safety plan with family and patient:  Patient is instructed to call 911 or present to the nearest emergency room should he experience any suicidal/homicidal ideation, auditory/visual/hallucinations, or detrimental worsening of his mental health condition.    Disposition: No evidence of imminent risk to self or others at present.   Supportive  therapy provided about ongoing stressors. Discussed crisis plan, support from social network, calling 911, coming to the Emergency Department, and calling Suicide Hotline.  This service was provided via telemedicine using a 2-way, interactive audio and video technology.  Names of all persons participating in this telemedicine service and their role in this encounter. Name: Assunta Found Role: NP  Name: Dr. Nelly Rout Role: Psychiatrist  Name: Glori Luis Role: Patient  Name: Dory Larsen Role: Friend    Secure message sent to patients' nurse Lilli Light, RN informing:  Psychiatric consult completed.  Patient psychiatrically cleared to follow up with outpatient psychiatric services.  Patient family wanting to take patient home but will need 30-day supply of medications (Abilify, Seroquel, Depakote, and  Cogentin).  Behavioral health coordinator or Social work/TOC will set up appointment with Dr. Marquis Buggy office for follow up medication management.   Please inform MD only default listed.   Ruthanna Macchia, NP 12/02/2021 12:25 PM

## 2021-12-02 NOTE — ED Notes (Signed)
Discharge instructions provided to Guardian and Patient.

## 2021-12-02 NOTE — ED Notes (Signed)
Spoke with counselor at [facility], pt will be reassessed today and is on list.

## 2021-12-02 NOTE — ED Notes (Addendum)
Pt moved to [facility]14 for tele assessment  Family reports Pt was able to sleep from 0430-630 and 0700-1015.

## 2021-12-02 NOTE — ED Notes (Signed)
Tele-psych in process.  Interpreter at bedside.

## 2023-03-27 ENCOUNTER — Ambulatory Visit (HOSPITAL_COMMUNITY)
Admission: EM | Admit: 2023-03-27 | Discharge: 2023-03-27 | Disposition: A | Discharge status: Home / Self Care | Payer: Medicaid Other | Attending: Family Medicine | Admitting: Family Medicine

## 2023-03-27 DIAGNOSIS — F5104 Psychophysiologic insomnia: Secondary | ICD-10-CM | POA: Insufficient documentation

## 2023-03-27 DIAGNOSIS — Z758 Other problems related to medical facilities and other health care: Secondary | ICD-10-CM

## 2023-03-27 DIAGNOSIS — R443 Hallucinations, unspecified: Secondary | ICD-10-CM | POA: Insufficient documentation

## 2023-03-27 DIAGNOSIS — F319 Bipolar disorder, unspecified: Secondary | ICD-10-CM | POA: Insufficient documentation

## 2023-03-27 DIAGNOSIS — G801 Spastic diplegic cerebral palsy: Secondary | ICD-10-CM | POA: Insufficient documentation

## 2023-03-27 MED ORDER — QUETIAPINE FUMARATE 50 MG PO TABS: 100.0000 mg | ORAL_TABLET | Freq: Every day | ORAL | 0 refills | Status: DC

## 2023-03-27 MED ORDER — DIPHENHYDRAMINE HCL 50 MG PO TABS: 50.0000 mg | ORAL_TABLET | Freq: Every evening | ORAL | 0 refills | Status: DC | PRN

## 2023-03-27 MED ORDER — MELATONIN 5 MG PO TABS: 5.0000 mg | ORAL_TABLET | Freq: Every evening | ORAL | 0 refills | Status: DC | PRN

## 2023-03-27 NOTE — ED Triage Notes (Signed)
Pt presents to [facility] voluntarily accompanied by family members due lack of sleep and chronic auditory hallucinations. Pt reports only having auditory hallucinations when he is alone at home, denies any currently. Pts mother states she would like medication to help the pt to sleep at night. Pt denies SI/HI and AVH.

## 2023-03-27 NOTE — ED Provider Notes (Cosign Needed Addendum)
Behavioral Health Urgent Care Medical Screening Exam  Patient Name: Phillip [name] MRN: [id number] Date of Evaluation: 03/27/23 Chief Complaint:  "Haven't slept in 2 days" Diagnosis:  Final diagnoses:  Bipolar I disorder ([place])  Hallucinations  Psychophysiological insomnia   Arabic Interpreter: Hussein [phone]    History of Present illness: Phillip [name] is a 22 y.o. male with a history of Bipolar disorder, agitation, spastic cerebral palsy, and developmental delays, presents voluntarily to  [facility] as a walk-in accompanied by his mother who is his primary caregiver and brother, who is concerned that patient has had 2 days of auditory hallucinations and insomnia and concerns for mania.   [name], 22 y.o., male patient seen face to face by this provider, consulted with Dr. Dwyane Dee; and chart reviewed on 03/27/23.    On evaluation Phillip [name] is sitting beside his mother calmly and quietly with his head looking down.  Arabic interpreter used as mother and brother provided a significant portion of the history of present illness.  According to mom for the last 48 hours patient has been unable to sleep during the night.  His typical bedtime is from 12 AM to 5 AM in the morning and he typically does not have problems with sleep.  Mother is concerned that his current medications are not working.  His brother yesterday gave double doses of all of his medications.  Mother has medication bottles with her during today's evaluation and patient is currently prescribed as follows: Abilify Maintena (dose unknown) next injection due 04/06/23.  Depakote 1000 mg at bedtime, propranolol 10 mg BID, Amantadine (unknown dose) and Seroquel 50 mg at bedtime. Patient's mother is requesting a "strong prescription for sleep medicine." Patient has a established mental health provider with an upcoming appointment on 4/10. Patient is followed by Kathleene Hazel FNP [phone] for psychiatric medication management.  Patient endorses that he hears voices when he's in his room alone and the voices tell him that "he's bad person". Patient denies actively hearing voices at present.   During evaluation Pervis Brandvold is sitting with his head downward in no acute distress. He is initially calm and cooperative however, did have a tearful outburst during evaluation. Patient was unable to express the reason he was tearful. Patient appears inattentive at time, although is oriented to self and appropriately oriented to situation. His mood is dysphoric with a congruent affect. Patient speech is at baseline. Objectively there is no evidence of psychosis/mania or delusional thinking. Patient doesn't appear to be responding to internal stimuli. Patient is able to converse coherently and not easily distracted. He denies suicidal/self-harm/homicidal ideation.  Patient answered question appropriately.       [place] ED from 03/27/2023 in [facility] ED from 11/28/2021 in [facility] Emergency Department at [place] No Risk No Risk       Psychiatric Specialty Exam  Presentation  General Appearance:Appropriate for Environment  Eye Contact:Fleeting  Speech:Slow  Speech Volume:Normal  Handedness:Right   Mood and Affect  Mood: Anxious; Dysphoric  Affect: Tearful; Congruent   Thought Process  Thought Processes:No data recorded Descriptions of Associations:-- (unable to adequately assess as patient is intermittently repsonding to questions via intepreter)  Orientation:No data recorded Thought Content:No data recorded Diagnosis of Schizophrenia or Schizoaffective disorder in past: No data recorded  Hallucinations:Auditory Hearing voices telling him he's bad  Ideas of Reference:No data recorded Suicidal Thoughts:No  Homicidal Thoughts:No   Sensorium  Memory: Immediate Fair  Judgment: Fair  Insight: Fair   Manufacturing systems engineer: Fair  Attention Span: Poor  Recall: [employer] of Knowledge: Fair  Language: Fair   Psychomotor Activity  Psychomotor Activity: Normal   Assets  Assets: Armed forces logistics/support/administrative officer; Desire for Improvement; Social Support   Sleep  Sleep: Poor (had not slept in 48 hours)  Number of hours: No data recorded  Physical Exam: Physical Exam Vitals reviewed.  Constitutional:      Appearance: Normal appearance.  HENT:     Head: Normocephalic.  Eyes:     Pupils: Pupils are equal, round, and reactive to light.  Cardiovascular:     Rate and Rhythm: Normal rate.  Pulmonary:     Effort: Pulmonary effort is normal.  Neurological:     General: No focal deficit present.     Mental Status: He is alert.     Review of Systems  Psychiatric/Behavioral:  Positive for hallucinations. The patient has insomnia.    Blood pressure (!) 120/56, pulse 84, resp. rate 18, SpO2 100 %. There is no height or weight on file to calculate BMI.  Musculoskeletal: Strength & Muscle Tone: within normal limits Gait & Station: normal Patient leans: [place] MSE Discharge Disposition for Follow up and Recommendations: Based on my evaluation the patient does not appear to have an emergency medical condition and can be discharged with resources and follow up care in outpatient services for follow-up with outpatient provider, Kathleene Hazel. Recommended increasing Seroquel from 50 mg at bedtime to 100 mg at  bedtime, melatonin 5 mg , and benadryl 50 mg as needed for sleep. Advised close follow-up with outpatient provider indicated for appropriate management. Advised to go the ED if symptoms becomes severe or patient becomes agitated.     Encounter note forwarded via EPIC-fax to psychiatric provider [phone]   Molli Barrows, FNP-C, PMHNP-BC  [place] [facility]gent [phone]  03/27/2023, 3:18 PM

## 2023-03-27 NOTE — Discharge Instructions (Addendum)
Increase Seroquel from 50 mg to 100 mg at bedtime Start Melatonin 5 mg at bedtime Start Benadryl 50 mg night at bedtime   Call psychiatric provider tomorrow morning and notify that patient was seen today and needs evaluation for medication management due to poor sleep and auditory hallucinations. If symptoms worsen, please go to the nearest emergency department for further evaluation.

## 2023-03-31 ENCOUNTER — Emergency Department (HOSPITAL_COMMUNITY)
Admission: EM | Admit: 2023-03-31 | Discharge: 2023-04-01 | Disposition: A | Discharge status: Home / Self Care | Payer: Medicaid Other | Attending: Emergency Medicine | Admitting: Emergency Medicine

## 2023-03-31 ENCOUNTER — Emergency Department (HOSPITAL_COMMUNITY): Payer: Medicaid Other

## 2023-03-31 ENCOUNTER — Encounter (HOSPITAL_COMMUNITY): Payer: Self-pay

## 2023-03-31 ENCOUNTER — Ambulatory Visit (HOSPITAL_COMMUNITY)
Admission: EM | Admit: 2023-03-31 | Discharge: 2023-03-31 | Disposition: A | Discharge status: Other Acute Inpatient Hospital | Payer: Medicaid Other | Attending: Family Medicine | Admitting: Family Medicine

## 2023-03-31 ENCOUNTER — Other Ambulatory Visit: Payer: Self-pay

## 2023-03-31 DIAGNOSIS — F840 Autistic disorder: Secondary | ICD-10-CM | POA: Diagnosis not present

## 2023-03-31 DIAGNOSIS — Z1152 Encounter for screening for COVID-19: Secondary | ICD-10-CM | POA: Diagnosis not present

## 2023-03-31 DIAGNOSIS — F309 Manic episode, unspecified: Secondary | ICD-10-CM | POA: Insufficient documentation

## 2023-03-31 DIAGNOSIS — G801 Spastic diplegic cerebral palsy: Secondary | ICD-10-CM | POA: Diagnosis not present

## 2023-03-31 DIAGNOSIS — F3110 Bipolar disorder, current episode manic without psychotic features, unspecified: Secondary | ICD-10-CM | POA: Diagnosis not present

## 2023-03-31 DIAGNOSIS — Z7689 Persons encountering health services in other specified circumstances: Secondary | ICD-10-CM

## 2023-03-31 DIAGNOSIS — Z79899 Other long term (current) drug therapy: Secondary | ICD-10-CM | POA: Insufficient documentation

## 2023-03-31 DIAGNOSIS — R1084 Generalized abdominal pain: Secondary | ICD-10-CM | POA: Diagnosis not present

## 2023-03-31 DIAGNOSIS — R451 Restlessness and agitation: Secondary | ICD-10-CM | POA: Insufficient documentation

## 2023-03-31 DIAGNOSIS — F319 Bipolar disorder, unspecified: Secondary | ICD-10-CM | POA: Diagnosis present

## 2023-03-31 DIAGNOSIS — Z01818 Encounter for other preprocedural examination: Secondary | ICD-10-CM | POA: Insufficient documentation

## 2023-03-31 DIAGNOSIS — R4689 Other symptoms and signs involving appearance and behavior: Secondary | ICD-10-CM

## 2023-03-31 DIAGNOSIS — R4587 Impulsiveness: Secondary | ICD-10-CM | POA: Insufficient documentation

## 2023-03-31 LAB — RESP PANEL BY RT-PCR (RSV, FLU A&B, COVID)  RVPGX2
Influenza A by PCR: NEGATIVE
Influenza B by PCR: NEGATIVE
Resp Syncytial Virus by PCR: NEGATIVE
SARS Coronavirus 2 by RT PCR: NEGATIVE

## 2023-03-31 LAB — COMPREHENSIVE METABOLIC PANEL
ALT: 16 U/L (ref 0–44)
AST: 17 U/L (ref 15–41)
Albumin: 4 g/dL (ref 3.5–5.0)
Alkaline Phosphatase: 57 U/L (ref 38–126)
Anion gap: 11 (ref 5–15)
BUN: 12 mg/dL (ref 6–20)
CO2: 24 mmol/L (ref 22–32)
Calcium: 8.7 mg/dL — ABNORMAL LOW (ref 8.9–10.3)
Chloride: 104 mmol/L (ref 98–111)
Creatinine, Ser: 0.7 mg/dL (ref 0.61–1.24)
GFR, Estimated: >60 mL/min (ref >60)
Glucose, Bld: 88 mg/dL (ref 70–99)
Potassium: 3.6 mmol/L (ref 3.5–5.1)
Sodium: 139 mmol/L (ref 135–145)
Total Bilirubin: 1 mg/dL (ref 0.3–1.2)
Total Protein: 7.2 g/dL (ref 6.5–8.1)

## 2023-03-31 LAB — RAPID URINE DRUG SCREEN, HOSP PERFORMED
Amphetamines: NOT DETECTED
Barbiturates: NOT DETECTED
Benzodiazepines: POSITIVE — AB
Cocaine: NOT DETECTED
Opiates: NOT DETECTED
Tetrahydrocannabinol: NOT DETECTED

## 2023-03-31 LAB — CBC
HCT: 43.5 % (ref 39.0–52.0)
Hemoglobin: 14.5 g/dL (ref 13.0–17.0)
MCH: 29.7 pg (ref 26.0–34.0)
MCHC: 33.3 g/dL (ref 30.0–36.0)
MCV: 89 fL (ref 80.0–100.0)
Platelets: 175 10*3/uL (ref 150–400)
RBC: 4.89 MIL/uL (ref 4.22–5.81)
RDW: 11.7 % (ref 11.5–15.5)
WBC: 6.9 10*3/uL (ref 4.0–10.5)
nRBC: 0 % (ref 0.0–0.2)

## 2023-03-31 LAB — ETHANOL: Alcohol, Ethyl (B): <10 mg/dL (ref <10)

## 2023-03-31 LAB — SALICYLATE LEVEL: Salicylate Lvl: <7 mg/dL — ABNORMAL LOW (ref 7.0–30.0)

## 2023-03-31 LAB — ACETAMINOPHEN LEVEL: Acetaminophen (Tylenol), Serum: <10 ug/mL — ABNORMAL LOW (ref 10–30)

## 2023-03-31 MED ORDER — ABILIFY MAINTENA 400 MG IM PRSY: 400.0000 mg | PREFILLED_SYRINGE | INTRAMUSCULAR | Status: DC

## 2023-03-31 MED ORDER — LORAZEPAM 1 MG PO TABS
1.0000 mg | ORAL_TABLET | ORAL | Status: AC
  Administered 2023-03-31: 1 mg via ORAL
  Filled 2023-03-31: qty 1

## 2023-03-31 MED ORDER — LORAZEPAM 1 MG PO TABS: 1.0000 mg | ORAL_TABLET | Freq: Once | ORAL | Status: DC

## 2023-03-31 NOTE — ED Notes (Signed)
Lab being called to obtain blood specimens

## 2023-03-31 NOTE — ED Notes (Addendum)
Patient will be transferred to [facility] from the [facility]. Patient will need medical clearance. Writer called and spoke with Judson Roch, RN-Charge Nurse. Patient will be coming with an EMTALA.
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
## 2023-03-31 NOTE — ED Notes (Signed)
[facility]  is here at this time to transport pt to [facility].

## 2023-03-31 NOTE — ED Triage Notes (Signed)
Pt here from [facility] via [facility]. See [place] notes in chart for report d/t [facility] did not stay to give RN report.
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
## 2023-03-31 NOTE — ED Notes (Signed)
Pt's brother changed pt into scrubs. Belongings obtained inventoried and placed in locker #3

## 2023-04-01 DIAGNOSIS — R451 Restlessness and agitation: Secondary | ICD-10-CM

## 2023-04-01 LAB — VALPROIC ACID LEVEL: Valproic Acid Lvl: 15 ug/mL — ABNORMAL LOW (ref 50.0–100.0)

## 2023-04-01 MED ORDER — ALUM & MAG HYDROXIDE-SIMETH 200-200-20 MG/5ML PO SUSP
30.0000 mL | Freq: Once | ORAL | Status: AC
  Administered 2023-04-01: 30 mL via ORAL
  Filled 2023-04-01: qty 30

## 2023-04-01 MED ORDER — PALIPERIDONE ER 3 MG PO TB24
3.0000 mg | ORAL_TABLET | Freq: Every day | ORAL | Status: DC
  Administered 2023-04-01: 3 mg via ORAL
  Filled 2023-04-01: qty 1

## 2023-04-01 MED ORDER — DIVALPROEX SODIUM ER 500 MG PO TB24: 500.0000 mg | ORAL_TABLET | Freq: Two times a day (BID) | ORAL | Status: DC

## 2023-04-01 MED ORDER — CLONAZEPAM 0.5 MG PO TABS: 0.5000 mg | ORAL_TABLET | Freq: Two times a day (BID) | ORAL | Status: DC | PRN

## 2023-04-01 MED ORDER — PALIPERIDONE ER 3 MG PO TB24: 3.0000 mg | ORAL_TABLET | Freq: Every day | ORAL | 0 refills | Status: DC

## 2023-04-01 MED ORDER — CLONAZEPAM 0.5 MG PO TABS: 0.5000 mg | ORAL_TABLET | Freq: Two times a day (BID) | ORAL | Status: DC

## 2023-04-01 MED ORDER — DIVALPROEX SODIUM ER 500 MG PO TB24
500.0000 mg | ORAL_TABLET | Freq: Two times a day (BID) | ORAL | Status: DC
  Administered 2023-04-01: 500 mg via ORAL
  Filled 2023-04-01: qty 1

## 2023-04-01 MED ORDER — TRAZODONE HCL 50 MG PO TABS: 50.0000 mg | ORAL_TABLET | Freq: Every day | ORAL | 0 refills | Status: DC

## 2023-04-01 MED ORDER — TRAZODONE HCL 50 MG PO TABS: 50.0000 mg | ORAL_TABLET | Freq: Every day | ORAL | Status: DC

## 2023-04-01 NOTE — Discharge Instructions (Signed)
Discharge recommendations:   Medications: Patient is to take medications as prescribed. The patient or patient's guardian is to contact a medical professional and/or outpatient provider to address any new side effects that develop. The patient or the patient's guardian should update outpatient providers of any new medications and/or medication changes.   Outpatient Follow up: Please review list of outpatient resources for psychiatry and counseling. Please follow up with your primary care provider for all medical related needs.  You are encouraged to follow up with [facility] for outpatient treatment.  Walk in/ Open Access Hours: Monday - Friday 8AM - 11AM (To see provider and therapist) Friday - 1PM - 4PM (To see therapist only)  [facility] [address] [place], [place] [phone]  Therapy: We recommend that patient participate in individual therapy to address mental health concerns.  Atypical antipsychotics: If you are prescribed an atypical antipsychotic, it is recommended that your height, weight, BMI, blood pressure, fasting lipid panel, and fasting blood sugar be monitored by your outpatient providers.  Safety:   The following safety precautions should be taken:   No sharp objects. This includes scissors, razors, scrapers, and putty knives.   Chemicals should be removed and locked up.   Medications should be removed and locked up.   Weapons should be removed and locked up. This includes firearms, knives and instruments that can be used to cause injury.   The patient should abstain from use of illicit substances/drugs and abuse of any medications.  If symptoms worsen or do not continue to improve or if the patient becomes actively suicidal or homicidal then it is recommended that the patient return to the closest hospital emergency department, the [facility], or call 911 for further evaluation and  treatment. National Suicide Prevention Lifeline 1-800-SUICIDE or 1-800-273-8255.  About 988 988 offers 24/7 access to trained crisis counselors who can help people experiencing mental health-related distress. People can call or text 988 or chat 988lifeline.org for themselves or if they are worried about a loved one who may need crisis support.     

## 2023-04-01 NOTE — Progress Notes (Signed)
Inpatient Behavioral Health Placement  Pt meets inpatient criteria per Joaquin Courts, FNP. There are no available beds per [facility] Fransico Michael, RN within the [facility] system. Referral was sent to the following facilities;    Destination  Service Provider Address Phone Fax  [facility]  [address] [place] [address] [phone]  [facility] [address]  [address] [place] [address] [phone]  [facility]  [address] [place] [address] [phone]  [facility]  [address] [place] [address] [phone]  [facility]  [address] [place], [place] [address] [phone]  [facility]  [address] [place] [address] [phone]  [facility]  [address] [place] [address] [phone]  [facility]  [address], [place] [address] [phone]  [facility]  [address] [place] [address] [phone]  [facility]  [address], [place] [address] [phone]  [facility]  [address], [place] [address] [phone]  [facility]  [address] [place] [address] [phone]   Situation ongoing,  CSW will follow up.   Maryjean Ka, MSW, LCSWA 04/01/2023  @ 1:17 AM

## 2023-04-01 NOTE — ED Provider Notes (Signed)
Emergency Medicine Observation Re-evaluation Note  Phillip [name] is a 22 y.o. male, seen on rounds today.  Pt initially presented to the ED for complaints of Medical Clearance Currently, the patient is resting, awaiting placement.  Physical Exam  BP 132/82 (BP Location: Right Arm)   Pulse 93   Temp 98.6 F (37 C) (Oral)   Resp 16   Ht 5\' 4"  (1.626 m)   Wt 55 kg   SpO2 100%   BMI 20.81 kg/m  Physical Exam General: Calm Cardiac: well perfused.  Lungs: even respirations Psych: calm  ED Course / MDM  EKG:   I have reviewed the labs performed to date as well as medications administered while in observation.  Recent changes in the last 24 hours include N/A.  Plan  Current plan is for inpatient psych placement.  12:46 PM  Made aware by TTS that the patient is cleared for discharge.  They have made med adjustments and sent these to the pharmacy.  Family at bedside to take patient home.  Appears stable for discharge.   Maia Plan, MD 04/01/23 1247

## 2023-04-01 NOTE — ED Notes (Signed)
Spoke with patient using interpreter services. Pt is now resting in the bed.

## 2023-04-01 NOTE — Progress Notes (Signed)
   04/01/23 0500  Spiritual Encounters  Type of Visit Initial  Care provided to: Patient  Referral source Patient request  Reason for visit Routine spiritual support  OnCall Visit Yes   Ch responded to page from ED. Ch provided sacred text to pt. No follow-up needed at this time.

## 2023-04-01 NOTE — ED Notes (Signed)
Brother at bedside.   Pt much calmer and less anxious with brother at bedside.

## 2023-04-01 NOTE — ED Notes (Signed)
Brother would like to be called when seen by provider

## 2023-04-01 NOTE — ED Notes (Signed)
Call for collateral TTS: Brother  Ahmed Elmyra Ricks  [phone], [phone]

## 2023-04-01 NOTE — Consult Note (Cosign Needed Addendum)
[facility] Face-to-Face Psychiatry Consult   Reason for Consult:  Patient here from [facility] secondary to behavioral outburst. Endrosing auditory hallucinations. Meds recently increased. Accompanied by brother. Arabic speaking  Referring Physician:Groce, Zoe Lan, PA-C     Patient Identification: Phillip [name] MRN:  [id number] Principal Diagnosis: Agitation Diagnosis:  Principal Problem:   Agitation Active Problems:   Bipolar 1 disorder   Total Time spent with patient: 30 minutes  Subjective:   Phillip [name] is a 22 y.o. male patient who was transferred from the [facility] to the [facility] emergency department for medical clearance.   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 On evaluation [name] is accompanied by his brother who provides the following history: Phillip [name]'s overall mental status has worsened since he was seen here at [facility] on 03/27/23. Up until last night patient was still unable to sleep and has becomes progressively more agitated and physically aggressive. Brother reports that patient was evaluated at [facility] 03/28/2023 and subsequently psych cleared and discharged with recommendations to follow-up with psychiatric mental health provider, Duffy Rhody [phone].    Collateral information: Spoke with  Psychiatric Mental Health Nurse Practitioner, Duffy Rhody [phone], who reports she has evaluated and prescribed medication management to Phillip [name] twice this week, 03/29/2023 and today. On Tuesday, according to [facility], patient seen more agitated and manic, and the following medications changes were made: Abilify  Maintena increased from 300 mg to 400 mg, Seroquel increased from 100 mg to 150 mg, Depakote 1,000 mg continued, and Klonopin 0.5 mg BID added.  Patient returned to provider office today and was severely agitated and disruptive. Per provider he was knocking items over, breaking things and attempted to hit or push his mother. Provider referred patient here to [facility] for acute crisis management.  Today, on evaluation using an arabic translator, the patient is alert and oriented x3. His thought process is liner with short responses. His speech is coherent. His mood is anxious and affect is congruent. He is cooperative. He tells me that he feels better today and wants to go home. He states that he was hearing some voices in his head that made him do bad things. He states that he is no longer hearing those voices today. He describes the voices as two voices, "internally and my voice." He denies visual hallucinations. There is no objective evidence on exam that the patient is responding to internal or external stimuli. He expresses that he does not like staying in the room alone because he feels lonely. He states that he would like to be with his family. He states that he lives with his mother, father and six siblings. He states that he hasn't been eating because he was fasting. He states that they brought him food in the hospital but he could not eat the food because it was pork and he's muslim. He states that he will try to eat his breakfast this morning. He states that he could not sleep last night because he kept waking up to check the time to see when he could go home. Nursing staff reports that he slept six hours last night and was observed pacing. Patient has not displayed any aggressive behaviors while in  the ED. Patient denies drinking alcohol or using illicit drugs. He states that sometimes the medications make him feel sleepy and he would like better medications. I discussed with the patient stopping the  Seroquel and Abilify maintena and starting invega 3 mg po daily. I discussed the risk and benefits of starting invega to help reduce hallucinations and adding trazodone 50 mg at bedtime for sleep. Patient verbalizes understanding.   I spoke to the patient's brother using an arabic translator with the patient present at length for approximately 45 minutes.[name] brother states that the patient has been feeling sad lately due to failing his test. 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 He states that the first time the patient had an episode like this was last year in January or February, after the patient's enragement ended. He states that he is considering changing the patient's outpatient provider to Dr[facility] Jannifer [facility] at [facility]. He states that Dr. Gloris Manchester office told him that the patient has to be stabilized in the hospital first before they can schedule an appointment. He states that the patient's appointment is scheduled a month out. I discussed making medication changes here in the ED and discharging the patient home later this afternoon if he responds well to the medications without any side effects or aggressive behaviors. I discussed that the patient can be seen for outpatient psychiatry follow up for medication management at the [facility] outpatient clinic next week during open access walk-in hours from 8-11 am, patient would need to arrive at 7:30 am to complete the screening process. I discussed stopping the Seroquel and Abilify maintena and starting invega 3 mg po daily. I discussed the risk and benefits of starting invega to help reduce hallucinations and adding trazodone 50 mg at bedtime to help with sleep. Patient will continue taking Depakote, recommend taking 500 mg po BID every 12 hours, continue klonopin 0.5 mg po BID prn for agitation. He verbalizes understanding.    Patient reassessed around 12:30 PM. Patient verbalizes readiness to  discharge. He denies medication side effects, no sob, chest pain, headache,or muscle spasms. Safety planning ,coping skills, medication compliance and outpatient follow up with psychiatry discussed with the patient and the patient's brother prior to discharge.   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   Risk to Self:  No Risk to Others:  No  Prior Inpatient Therapy:  No  Prior Outpatient Therapy:  Yes   Past Medical History:  Past Medical History:  Diagnosis Date   Bipolar 1 disorder    History reviewed. No pertinent surgical history.  Family Psychiatric  History: No known family history.   Social History:  Social History   Substance and Sexual Activity  Alcohol Use Not Currently     Social History   Substance and Sexual Activity  Drug Use Not Currently    Social History   Socioeconomic History   Marital status: Single    Spouse name: Not on file   Number of children: Not on file   Years of education: Not on file   Highest education level: Not on file  Occupational History   Not on file  Tobacco Use   Smoking status: Never   Smokeless tobacco: Never  Substance and Sexual Activity   Alcohol use: Not Currently   Drug use: Not Currently   Sexual activity: Not on file  Other Topics Concern   Not on file  Social History Narrative  Not on file   Social Determinants of Health   Financial Resource Strain: Not on file  Food Insecurity: Not on file  Transportation Needs: Not on file  Physical Activity: Not on file  Stress: Not on file  Social Connections: Not on file   Additional Social History:    Allergies:  No Known Allergies  Labs:  Results for orders placed or performed during the hospital encounter of 03/31/23 (from the past 48 hour(s))  Resp panel by RT-PCR (RSV, Flu A&B, Covid) Anterior Nasal Swab     Status: None   Collection Time: 03/31/23  5:08 PM   Specimen: Anterior Nasal Swab  Result Value Ref Range    SARS Coronavirus 2 by RT PCR NEGATIVE NEGATIVE   Influenza A by PCR NEGATIVE NEGATIVE   Influenza B by PCR NEGATIVE NEGATIVE    Comment: (NOTE) The Xpert Xpress SARS-CoV-2/FLU/RSV plus assay is intended as an aid in the diagnosis of influenza from Nasopharyngeal swab specimens and should not be used as a sole basis for treatment. Nasal washings and aspirates are unacceptable for Xpert Xpress SARS-CoV-2/FLU/RSV testing.  Fact Sheet for Patients: BloggerCourse.com  Fact Sheet for Healthcare Providers: SeriousBroker.it  This test is not yet approved or cleared by the [place] FDA and has been authorized for detection and/or diagnosis of SARS-CoV-2 by FDA under an Emergency Use Authorization (EUA). This EUA will remain in effect (meaning this test can be used) for the duration of the COVID-19 declaration under Section 564(b)(1) of the Act, 21 U.S.C. section 360bbb-3(b)(1), unless the authorization is terminated or revoked.     Resp Syncytial Virus by PCR NEGATIVE NEGATIVE    Comment: (NOTE) Fact Sheet for Patients: BloggerCourse.com  Fact Sheet for Healthcare Providers: SeriousBroker.it  This test is not yet approved or cleared by the [place] FDA and has been authorized for detection and/or diagnosis of SARS-CoV-2 by FDA under an Emergency Use Authorization (EUA). This EUA will remain in effect (meaning this test can be used) for the duration of the COVID-19 declaration under Section 564(b)(1) of the Act, 21 U.S.C. section 360bbb-3(b)(1), unless the authorization is terminated or revoked.  Performed at [facility], [address], [place], [place] [address]   Comprehensive metabolic panel     Status: Abnormal   Collection Time: 03/31/23  8:07 PM  Result Value Ref Range   Sodium 139 135 - 145 mmol/L   Potassium 3.6 3.5 - 5.1 mmol/L   Chloride 104 98 - 111  mmol/L   CO2 24 22 - 32 mmol/L   Glucose, Bld 88 70 - 99 mg/dL    Comment: Glucose reference range applies only to samples taken after fasting for at least 8 hours.   BUN 12 6 - 20 mg/dL   Creatinine, Ser 0.98 0.61 - 1.24 mg/dL   Calcium 8.7 (L) 8.9 - 10.3 mg/dL   Total Protein 7.2 6.5 - 8.1 g/dL   Albumin 4.0 3.5 - 5.0 g/dL   AST 17 15 - 41 U/L   ALT 16 0 - 44 U/L   Alkaline Phosphatase 57 38 - 126 U/L   Total Bilirubin 1.0 0.3 - 1.2 mg/dL   GFR, Estimated >11 >91 mL/min    Comment: (NOTE) Calculated using the CKD-EPI Creatinine Equation (2021)    Anion gap 11 5 - 15    Comment: Performed at [facility], [address], [place], [place] [address]  cbc     Status: None   Collection Time:  03/31/23  8:07 PM  Result Value Ref Range   WBC 6.9 4.0 - 10.5 K/uL   RBC 4.89 4.22 - 5.81 MIL/uL   Hemoglobin 14.5 13.0 - 17.0 g/dL   HCT 16.1 09.6 - 04.5 %   MCV 89.0 80.0 - 100.0 fL   MCH 29.7 26.0 - 34.0 pg   MCHC 33.3 30.0 - 36.0 g/dL   RDW 40.9 81.1 - 91.4 %   Platelets 175 150 - 400 K/uL   nRBC 0.0 0.0 - 0.2 %    Comment: Performed at [facility], [address], [place], [place] [address]  Ethanol     Status: None   Collection Time: 03/31/23  8:08 PM  Result Value Ref Range   Alcohol, Ethyl (B) <10 <10 mg/dL    Comment: (NOTE) Lowest detectable limit for serum alcohol is 10 mg/dL.  For medical purposes only. Performed at [facility], [address], [place], [place] [address]   Acetaminophen level     Status: Abnormal   Collection Time: 03/31/23  8:08 PM  Result Value Ref Range   Acetaminophen (Tylenol), Serum <10 (L) 10 - 30 ug/mL    Comment: (NOTE) Therapeutic concentrations vary significantly. A range of 10-30 ug/mL  may be an effective concentration for many patients. However, some  are best treated at concentrations outside of this range. Acetaminophen concentrations >150 ug/mL at 4 hours after ingestion  and >50 ug/mL at 12 hours after ingestion  are often associated with  toxic reactions.  Performed at [facility], [address] [place], [place] [address]   Salicylate level     Status: Abnormal   Collection Time: 03/31/23  8:08 PM  Result Value Ref Range   Salicylate Lvl <7.0 (L) 7.0 - 30.0 mg/dL    Comment: Performed at [facility], [address] [place], [place] [address]  Rapid urine drug screen (hospital performed)     Status: Abnormal   Collection Time: 03/31/23  8:57 PM  Result Value Ref Range   Opiates NONE DETECTED NONE DETECTED   Cocaine NONE DETECTED NONE DETECTED   Benzodiazepines POSITIVE (A) NONE DETECTED   Amphetamines NONE DETECTED NONE DETECTED   Tetrahydrocannabinol NONE DETECTED NONE DETECTED   Barbiturates NONE DETECTED NONE DETECTED    Comment: (NOTE) DRUG SCREEN FOR MEDICAL PURPOSES ONLY.  IF CONFIRMATION IS NEEDED FOR ANY PURPOSE, NOTIFY LAB WITHIN 5 DAYS.  LOWEST DETECTABLE LIMITS FOR URINE DRUG SCREEN Drug Class                     Cutoff (ng/mL) Amphetamine and metabolites    1000 Barbiturate and metabolites    200 Benzodiazepine                 200 Opiates and metabolites        300 Cocaine and metabolites        300 THC                            50 Performed at [facility], [address] [place], [place] [address]     No current facility-administered medications for this encounter.   Current Outpatient Medications  Medication Sig Dispense Refill   Amantadine HCl 100 MG tablet Take 100 mg by mouth daily.     [START ON 04/27/2023] ARIPiprazole ER (ABILIFY MAINTENA) 400 MG PRSY prefilled syringe Inject 400 mg into  the muscle every 28 (twenty-eight) days for 28 days. 1 each    cetirizine (ZYRTEC) 10 MG tablet Take 10 mg by mouth daily.     divalproex (DEPAKOTE ER) 500 MG 24 hr tablet Take 2 tablets (1,000 mg total) by mouth at bedtime. 60 tablet 0   pantoprazole (PROTONIX) 40 MG tablet Take 40 mg by mouth daily.     propranolol (INDERAL) 10 MG tablet Take 10 mg  by mouth 2 (two) times daily.     QUEtiapine (SEROQUEL) 50 MG tablet Take 2 tablets (100 mg total) by mouth at bedtime. 60 tablet 0   ARIPiprazole (ABILIFY) 15 MG tablet Take 1 tablet (15 mg total) by mouth daily. (Patient not taking: Reported on 04/01/2023) 30 tablet 0   benztropine (COGENTIN) 0.5 MG tablet Take 1 tablet (0.5 mg total) by mouth 2 (two) times daily. (Patient not taking: Reported on 04/01/2023) 60 tablet 0   diphenhydrAMINE (BENADRYL) 50 MG tablet Take 1 tablet (50 mg total) by mouth at bedtime as needed for itching. (Patient not taking: Reported on 04/01/2023) 30 tablet 0   melatonin 5 MG TABS Take 1 tablet (5 mg total) by mouth at bedtime as needed (for sleep). (Patient not taking: Reported on 04/01/2023) 30 tablet 0    Musculoskeletal: Strength & Muscle Tone: within normal limits Gait & Station: normal Patient leans: N/A   Psychiatric Specialty Exam:  Presentation  General Appearance:  Appropriate for Environment  Eye Contact: Fair  Speech: Clear and Coherent  Speech Volume: Decreased  Handedness: Right   Mood and Affect  Mood: Anxious  Affect: Congruent   Thought Process  Thought Processes: Linear  Descriptions of Associations:Intact  Orientation:Partial  Thought Content:Logical  History of Schizophrenia/Schizoaffective disorder:No  Duration of Psychotic Symptoms:N/A  Hallucinations:Hallucinations: Auditory Description of Auditory Hallucinations: "bad voices"  Ideas of Reference:None  Suicidal Thoughts:Suicidal Thoughts: No  Homicidal Thoughts:Homicidal Thoughts: No   Sensorium  Memory: Immediate Fair; Recent Fair  Judgment: Intact  Insight: Present; Fair   Art therapistxecutive Functions  Concentration: Fair  Attention Span: Fair  Recall: FiservFair  Fund of Knowledge: Fair  Language: Fair   Psychomotor Activity  Psychomotor Activity: Psychomotor Activity: Restlessness   Assets  Assets: Manufacturing systems engineerCommunication Skills; Desire for  Improvement; Social Support; Health and safety inspectorinancial Resources/Insurance; Housing   Sleep  Sleep: Sleep: Poor Number of Hours of Sleep: 2   Physical Exam: Physical Exam Cardiovascular:     Rate and Rhythm: Normal rate.  Pulmonary:     Effort: Pulmonary effort is normal.  Musculoskeletal:        General: Normal range of motion.  Neurological:     Mental Status: He is alert and oriented to person, place, and time.    Review of Systems  Constitutional: Negative.   HENT: Negative.    Eyes: Negative.   Respiratory: Negative.    Cardiovascular: Negative.   Neurological: Negative.    Blood pressure 132/82, pulse 93, temperature 98.6 F (37 C), temperature source Oral, resp. rate 16, height 5\' 4"  (1.626 m), weight 55 kg, SpO2 100 %. Body mass index is 20.81 kg/m.  Treatment Plan Summary: Patient psychiatrically cleared for discharge. [facility] during open access hours for medication management.   Medication adjustments: discontinue Seroquel and Abilify maintena. Start invega 3 mg po daily and trazodone 50 mg at bedtime to help with sleep. Continue Depakote, recommend taking 500 mg po BID every 12 hours, continue klonopin 0.5 mg po BID prn for agitation.  Labs ordered: valproic and EKG.    Disposition: No evidence of imminent risk to self or others at present.   Patient does not meet criteria for psychiatric inpatient admission. Supportive therapy provided about ongoing stressors. Discussed crisis plan, support from social network, calling 911, coming to the Emergency Department, and calling Suicide Hotline. Although this patient presented aggressive behaviors, he does not appear to be at imminent risk of dangerousness to self and dangerousness to others at this time. While future psychiatric events cannot be accurately predicted, the patient does not necessitate nor desire further acute inpatient psychiatric care at this time. These are mitigated by  protective factors which include lack of active SI/HI, no history of previous suicide attempts , sense of responsibility to family and social supports, presence of an available support system, religion, enjoyment of leisure actvities, expresses purpose for living, and medication changes.     Carolan Avedisian L, NP 04/01/2023 9:15 AM

## 2023-04-02 ENCOUNTER — Telehealth: Payer: Self-pay

## 2023-04-02 NOTE — Telephone Encounter (Signed)
Patient called to state that he could not get a hold of pharmacy. When this RNCM called,  the  store phone was out of service.   [phone]

## 2023-04-02 NOTE — Telephone Encounter (Signed)
Called pharmacy, the phone number is incorrect on script and online. It is [phone]. They stated the medication Paliperidone needs prior authorization from insurance. The caller was told this.

## 2023-04-04 ENCOUNTER — Ambulatory Visit (INDEPENDENT_AMBULATORY_CARE_PROVIDER_SITE_OTHER): Payer: Medicaid Other | Admitting: Psychiatry

## 2023-04-04 ENCOUNTER — Encounter (HOSPITAL_COMMUNITY): Payer: Self-pay | Admitting: Psychiatry

## 2023-04-04 VITALS — BP 113/66 | HR 81 | Temp 97.5°F | Wt 151.4 lb

## 2023-04-04 DIAGNOSIS — F411 Generalized anxiety disorder: Secondary | ICD-10-CM | POA: Diagnosis not present

## 2023-04-04 DIAGNOSIS — F319 Bipolar disorder, unspecified: Secondary | ICD-10-CM

## 2023-04-04 MED ORDER — AMANTADINE HCL 100 MG PO TABS
100.0000 mg | ORAL_TABLET | Freq: Every day | ORAL | 3 refills | Status: DC
Start: 2023-04-04 — End: 2024-01-10

## 2023-04-04 MED ORDER — PALIPERIDONE ER 3 MG PO TB24: 3.0000 mg | ORAL_TABLET | Freq: Every day | ORAL | 0 refills | Status: DC

## 2023-04-04 MED ORDER — PALIPERIDONE ER 3 MG PO TB24: 3.0000 mg | ORAL_TABLET | Freq: Every day | ORAL | 3 refills | Status: DC

## 2023-04-04 MED ORDER — PROPRANOLOL HCL 10 MG PO TABS: 10.0000 mg | ORAL_TABLET | Freq: Three times a day (TID) | ORAL | 3 refills | Status: DC

## 2023-04-04 MED ORDER — TRAZODONE HCL 50 MG PO TABS
50.0000 mg | ORAL_TABLET | Freq: Every day | ORAL | 3 refills | Status: DC
Start: 2023-04-04 — End: 2023-07-14

## 2023-04-04 MED ORDER — DIVALPROEX SODIUM ER 500 MG PO TB24
500.0000 mg | ORAL_TABLET | Freq: Two times a day (BID) | ORAL | Status: DC
Start: 2023-04-04 — End: 2023-07-14
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
## 2023-05-03 ENCOUNTER — Telehealth (HOSPITAL_COMMUNITY): Payer: Self-pay | Admitting: *Deleted

## 2023-05-03 NOTE — Telephone Encounter (Signed)
PA obtained for patients [facility] ER. Called [place] [facility] and first person I spoke with stated he had [employer], our chart indicates [employer] [facility] but no ID number on file. I called [facility] and they provided ID # [id number] and I was able to get him approved with this. Pharmacy notified. PA # [id number] and its effective till 04/27/24

## 2023-05-04 ENCOUNTER — Telehealth (HOSPITAL_COMMUNITY): Payer: Self-pay

## 2023-05-05 ENCOUNTER — Other Ambulatory Visit (HOSPITAL_COMMUNITY): Payer: Self-pay | Admitting: Psychiatry

## 2023-05-05 DIAGNOSIS — F319 Bipolar disorder, unspecified: Secondary | ICD-10-CM

## 2023-05-05 MED ORDER — PALIPERIDONE ER 3 MG PO TB24: 3.0000 mg | ORAL_TABLET | Freq: Every day | ORAL | 3 refills | Status: DC

## 2023-05-05 NOTE — Telephone Encounter (Signed)
Medication refilled and sent to preferred pharmacy

## 2023-05-16 ENCOUNTER — Telehealth (INDEPENDENT_AMBULATORY_CARE_PROVIDER_SITE_OTHER): Payer: Medicaid Other | Admitting: Psychiatry

## 2023-05-16 ENCOUNTER — Encounter (HOSPITAL_COMMUNITY): Payer: Self-pay | Admitting: Psychiatry

## 2023-05-16 DIAGNOSIS — F319 Bipolar disorder, unspecified: Secondary | ICD-10-CM | POA: Diagnosis not present

## 2023-05-16 MED ORDER — PALIPERIDONE ER 6 MG PO TB24: 6.0000 mg | ORAL_TABLET | Freq: Every day | ORAL | 3 refills | Status: DC

## 2023-05-16 MED ORDER — ZOLPIDEM TARTRATE 5 MG PO TABS
5.0000 mg | ORAL_TABLET | Freq: Every evening | ORAL | 1 refills | Status: DC | PRN
Start: 2023-05-16 — End: 2023-07-14

## 2023-05-16 NOTE — Progress Notes (Signed)
BH MD/PA/NP OP Progress Note  05/16/2023 3:11 PM Phillip [name]  MRN:  [id number]  Chief Complaint: "The voices tell me I'm going to hell" "He is not sleeping"  HPI: 22 year old male seen today for follow up psychiatric evaluation.  He has a psychiatric history of Bipolar disorder and possible IDD. He is currently managed on Invega 3 mg daily, Depakote 500 mg twice daily, propranolol 10 mg three times daily, Amantadine 100 mg daily, and trazodone 50-100 mg nightly.  He notes his medications are somewhat effective in managing his psychiatric conditions.   Today patient is lethargic and weak.  Patient brother assisted him with standing and walking.  His speech is slow and he presents with some thought blocking.  He informed Clinical research associate that his voices tell him that he is going to hell.  He also notes that he has racing thoughts.  Patient was seen with his brother who notes that he is not sleeping.  He informed Clinical research associate that his brother sleeps approximately 1 hour daily.  He also notes that his brother cries inappropriately.  Today during exam patient laughed inappropriately and shouted out inappropriately.  Patient seems to be responding to internal stimuli.  He denies visual hallucinations however.  Patients  brother notes that at times he is able to speak normally and then later on he has poverty of speech.  He reports that at times he only says 1 phrased words.  Patient distracted during exam.  He was unable to do a GAD-7 or PHQ-9.  Patient's brother also informed Clinical research associate that he has been forgetful.  Today he was unable to accurately state the current date and time.  He was however alert to self, his brother, and the current month.  Today he denies SI/HI or paranoia.  Patient's brother reports that he is concerned about his mental health.  He notes that at times he becomes irritable and annoyed.  He also informed Clinical research associate that his brother becomes restless and he fears that he will leave his home.  Patient's  brother reports that Mr. Ayads appetite has been reduced.  He informed Clinical research associate that he does not eat without being told.  Provider recommended that patient's brother give him reminders to eat, bathe, and take medications.  They endorsed understanding and agreed.  Patient notes that at times he has abdominal pain.  Provider instructed patient to follow-up with primary care regarding this.  Patient denies feeling anxious today.  His brother informed Clinical research associate that his propranolol was not filled as it was waiting on prior authorization. He informed Clinical research associate that he has been giving his brother his propranolol which he reports is 20 mg twice a day.  Provider informed patient and his brother that his increased fatigue and unsteady gait could be due to propranolol.  Provider recommended discontinuing propranolol.  He endorsed understanding and agreed.  Today Invega 3 mg increased to 6 mg to help manage symptoms of psychosis and bizarre behavior.  Patient agreeable also to starting Ambien 5 mg nightly to help manage sleep.  At this time propranolol discontinued.  Provider instructed patient and brother to taper dose over a week.  They endorsed understanding and agreed.  Provider also informed patient and brother o only take Klonopin as needed.  He endorsed understanding and agreed.  Klonopin will be tapered or discontinued at future visits.  He will continue other medications as prescribed.  No other concerns at this time.    Visit Diagnosis:    ICD-10-CM   1. Bipolar 1  disorder (HCC)  F31.9 paliperidone (INVEGA) 6 MG 24 hr tablet    zolpidem (AMBIEN) 5 MG tablet      Past Psychiatric History: Bipolar disorder and possible IDD   Past Medical History:  Past Medical History:  Diagnosis Date   Bipolar 1 disorder (HCC)    No past surgical history on file.  Family Psychiatric History:  Brother depression   Family History: No family history on file.  Social History:  Social History   Socioeconomic History    Marital status: Single    Spouse name: Not on file   Number of children: Not on file   Years of education: Not on file   Highest education level: Not on file  Occupational History   Not on file  Tobacco Use   Smoking status: Never   Smokeless tobacco: Never  Substance and Sexual Activity   Alcohol use: Not Currently   Drug use: Not Currently   Sexual activity: Not on file  Other Topics Concern   Not on file  Social History Narrative   Not on file   Social Determinants of Health   Financial Resource Strain: Not on file  Food Insecurity: Not on file  Transportation Needs: Not on file  Physical Activity: Not on file  Stress: Not on file  Social Connections: Not on file    Allergies: No Known Allergies  Metabolic Disorder Labs: No results found for: "HGBA1C", "MPG" No results found for: "PROLACTIN" No results found for: "CHOL", "TRIG", "HDL", "CHOLHDL", "VLDL", "LDLCALC" No results found for: "TSH"  Therapeutic Level Labs: No results found for: "LITHIUM" Lab Results  Component Value Date   VALPROATE 15 (L) 04/01/2023   VALPROATE 81 11/28/2021   No results found for: "CBMZ"  Current Medications: Current Outpatient Medications  Medication Sig Dispense Refill   zolpidem (AMBIEN) 5 MG tablet Take 1 tablet (5 mg total) by mouth at bedtime as needed for sleep. 30 tablet 1   Amantadine HCl 100 MG tablet Take 1 tablet (100 mg total) by mouth daily. 30 tablet 3   cetirizine (ZYRTEC) 10 MG tablet Take 10 mg by mouth daily.     clonazePAM (KLONOPIN) 0.5 MG tablet Take 1 tablet (0.5 mg total) by mouth 2 (two) times daily as needed (agitation).     divalproex (DEPAKOTE ER) 500 MG 24 hr tablet Take 1 tablet (500 mg total) by mouth 2 (two) times daily.     paliperidone (INVEGA) 6 MG 24 hr tablet Take 1 tablet (6 mg total) by mouth daily. 30 tablet 3   pantoprazole (PROTONIX) 40 MG tablet Take 40 mg by mouth daily.     propranolol (INDERAL) 10 MG tablet Take 1 tablet (10 mg total)  by mouth 3 (three) times daily. 90 tablet 3   traZODone (DESYREL) 50 MG tablet Take 1-2 tablets (50-100 mg total) by mouth at bedtime. 60 tablet 3   No current facility-administered medications for this visit.     Musculoskeletal: Strength & Muscle Tone: decreased Gait & Station: unsteady Patient leans: N/A  Psychiatric Specialty Exam: Review of Systems  Blood pressure (!) 112/57, pulse 76, temperature 98.2 F (36.8 C), weight 145 lb 3.2 oz (65.9 kg), SpO2 100 %.Body mass index is 24.92 kg/m.  General Appearance: Well Groomed  Eye Contact:  Fair  Speech:  Slow  Volume:  Decreased  Mood:  Anxious and Depressed  Affect:  Appropriate and Congruent  Thought Process:  Coherent, Goal Directed, and Linear  Orientation:  Full (Time, Place,  and Person)  Thought Content: Hallucinations: Auditory   Suicidal Thoughts:  No  Homicidal Thoughts:  No  Memory:  Immediate;   Fair Recent;   Fair Remote;   Fair  Judgement:  Fair  Insight:  Fair  Psychomotor Activity:  Decreased  Concentration:  Concentration: Fair and Attention Span: Fair  Recall:  [employer] of Knowledge: Good  Language: Fair  Akathisia:  No  Handed:  Right  AIMS (if indicated): not done  Assets:  Communication Skills Desire for Improvement Financial Resources/Insurance Housing Leisure Time Physical Health Social Support  ADL's:  Intact  Cognition: WNL  Sleep:  Poor   Screenings: GAD-7    Flowsheet Row [facility] Visit from 04/04/2023 in [facility]  Total GAD-7 Score 11      PHQ2-9    Flowsheet Row Office Visit from 04/04/2023 in [place]  PHQ-2 Total Score 1  PHQ-9 Total Score 13      Flowsheet Row ED from 03/31/2023 in [facility] Emergency Department at [facility] Most recent reading at 03/31/2023  5:09 PM ED from 03/31/2023 in [facility] Most recent reading at 03/31/2023  2:57 PM ED from 03/27/2023 in [facility] Most recent reading at 03/27/2023  2:50 PM  C-SSRS RISK CATEGORY No Risk No Risk No Risk        Assessment and Plan: Patient presents with symptoms of insomnia and AH.  During exam patient gait was also unsteady.  Patient denies feeling anxious today.  His brother informed Clinical research associate that his propranolol was not filled as it was waiting on prior authorization. He informed Clinical research associate that he has been giving his brother his propranolol which he reports is 20 mg twice a day.  Provider informed patient and his brother that his increased fatigue and unsteady gait could be due to propranolol.  Provider recommended discontinuing propranolol.  He endorsed understanding and agreed.  Today Invega 3 mg increased to 6 mg to help manage symptoms of psychosis and bizarre behavior.  Patient agreeable also to starting Ambien 5 mg nightly to help manage sleep.  At this time propranolol discontinued.  Provider instructed patient and brother to taper dose over a week.  They endorsed understanding and agreed.  Provider also informed patient and brother to only take Klonopin as needed.  He endorsed understanding and agreed.  Klonopin will be tapered or discontinued at future visits.  1. Bipolar 1 disorder (HCC)  Increased- paliperidone (INVEGA) 6 MG 24 hr tablet; Take 1 tablet (6 mg total) by mouth daily.  Dispense: 30 tablet; Refill: 3 Start- zolpidem (AMBIEN) 5 MG tablet; Take 1 tablet (5 mg total) by mouth at bedtime as needed for sleep.  Dispense: 30 tablet; Refill: 1   Collaboration of Care: Collaboration of Care: Other provider involved in patient's care AEB PCP  Patient/Guardian was advised Release of Information must be obtained prior to any record release in order to collaborate their care with an outside provider. Patient/Guardian was advised if they have not already done so to contact the registration department to sign all necessary forms in order for [place] to release information  regarding their care.   Consent: Patient/Guardian gives verbal consent for treatment and assignment of benefits for services provided during this visit. Patient/Guardian expressed understanding and agreed to proceed.   Follow-up in 2 months Shanna Cisco, NP 05/16/2023, 3:11 PM

## 2023-05-19 ENCOUNTER — Ambulatory Visit (HOSPITAL_COMMUNITY)
Admission: EM | Admit: 2023-05-19 | Discharge: 2023-05-19 | Disposition: A | Discharge status: Home / Self Care | Payer: Medicaid Other

## 2023-05-19 ENCOUNTER — Encounter (HOSPITAL_COMMUNITY): Payer: Self-pay | Admitting: Psychiatry

## 2023-05-19 ENCOUNTER — Ambulatory Visit (INDEPENDENT_AMBULATORY_CARE_PROVIDER_SITE_OTHER): Payer: Medicaid Other | Admitting: Psychiatry

## 2023-05-19 ENCOUNTER — Telehealth (HOSPITAL_COMMUNITY): Payer: Medicaid Other | Admitting: Psychiatry

## 2023-05-19 DIAGNOSIS — F319 Bipolar disorder, unspecified: Secondary | ICD-10-CM | POA: Diagnosis not present

## 2023-05-19 MED ORDER — OLANZAPINE 5 MG PO TABS
5.0000 mg | ORAL_TABLET | Freq: Every day | ORAL | 3 refills | Status: DC
Start: 2023-05-19 — End: 2023-07-14

## 2023-05-19 MED ORDER — OLANZAPINE 10 MG PO TABS
10.0000 mg | ORAL_TABLET | Freq: Every day | ORAL | 3 refills | Status: DC
Start: 2023-05-19 — End: 2023-07-14

## 2023-05-19 NOTE — Progress Notes (Signed)
   05/19/23 1232  [facility] Triage Screening (Walk-ins at [facility] only)  How Did You Hear About [place]? Family/Friend  What Is the Reason for Your Visit/Call Today? Pt presents to [facility] voluntarily accompanied by family members due chronic auditory hallucinations. Per pts brother, the pts behavior started to become worse this week. Pts brother reports the pt was experiencing auditory hallucinations. Per pts brothers report the pt was seen at [facility] this week and his medications were adjusted. Pts brother states that the medication is not working properly. Unable to assess pt further due to current presentation.  How Long Has This Been Causing You Problems? <Week  Have You Recently Had Any Thoughts About Hurting Yourself?  (UTA)  Are You Planning to Commit Suicide/Harm Yourself At This time?  (UTA)  Have you Recently Had Thoughts About Hurting Someone Else?  (UTA)  Are You Planning To Harm Someone At This Time?  (UTA)  Are you currently experiencing any auditory, visual or other hallucinations?  (UTA)  Have You Used Any Alcohol or Drugs in the Past 24 Hours?  (UTA)  Do you have any current medical co-morbidities that require immediate attention?  (UTA)  Clinician description of patient physical appearance/behavior: anxious, tearful  What Do You Feel Would Help You the Most Today? Medication(s);Treatment for Depression or other mood problem  If access to [facility] was not available, would you have sought care in the Emergency Department? No  Determination of Need Routine (7 days)  Options For Referral Outpatient Therapy;Medication Management

## 2023-05-19 NOTE — Progress Notes (Signed)
BH MD/PA/NP OP Progress Note  05/19/2023 3:36 PM Margie Wold  MRN:  [id number]  Chief Complaint: "What are you saying" "He is not sleeping and acting bizarre"  HPI: 22 year old male walked in to the clinic today psychiatric evaluation.  He has a psychiatric history of Bipolar disorder and possible IDD. He is currently managed on Invega 6 mg daily, Ambien 5 mg, Depakote 500 mg twice daily, propranolol 10 mg three times daily, Amantadine 100 mg daily, and trazodone 50-100 mg nightly.  He notes his medications are somewhat effective in managing his psychiatric conditions.   Today patient is was unable to focus during the visit.  He presented with thought blocking.  During visit patient screamed out what are you saying.  He laughed inappropriately.  Patient unable to sit still, paced, and needed constant redirection.  Patient was seen with his brother who notes that his sleep continues to be poor.  He informed Clinical research associate that he is only sleeping 1 hour nightly with Ambien 5 mg.  Patient brother reports that last night he gave him two 5 mg tablets of Ambien and found it ineffective.  Provider spoke to attending physician Dr. Alyse Low who recommended discontinuing Invega and switching to Zyprexa twice daily.  Patient and his brother were notified of this plan and was agreeable.   Today Zyprexa 5 mg daily and Zyprexa 10 mg nightly ordered.  Invega 6 mg discontinued.  Potential side effects of medication and risks vs benefits of treatment vs non-treatment were explained and discussed. All questions were answered. Patient will continue all other medications as prescribed.  No other concerns noted at this time.     Visit Diagnosis:    ICD-10-CM   1. Bipolar 1 disorder (HCC)  F31.9 OLANZapine (ZYPREXA) 5 MG tablet    OLANZapine (ZYPREXA) 10 MG tablet      Past Psychiatric History: Bipolar disorder and possible IDD   Past Medical History:  Past Medical History:  Diagnosis Date   Bipolar 1 disorder (HCC)     No past surgical history on file.  Family Psychiatric History:  Brother depression   Family History: No family history on file.  Social History:  Social History   Socioeconomic History   Marital status: Single    Spouse name: Not on file   Number of children: Not on file   Years of education: Not on file   Highest education level: Not on file  Occupational History   Not on file  Tobacco Use   Smoking status: Never   Smokeless tobacco: Never  Substance and Sexual Activity   Alcohol use: Not Currently   Drug use: Not Currently   Sexual activity: Not on file  Other Topics Concern   Not on file  Social History Narrative   Not on file   Social Determinants of Health   Financial Resource Strain: Not on file  Food Insecurity: Not on file  Transportation Needs: Not on file  Physical Activity: Not on file  Stress: Not on file  Social Connections: Not on file    Allergies: No Known Allergies  Metabolic Disorder Labs: No results found for: "HGBA1C", "MPG" No results found for: "PROLACTIN" No results found for: "CHOL", "TRIG", "HDL", "CHOLHDL", "VLDL", "LDLCALC" No results found for: "TSH"  Therapeutic Level Labs: No results found for: "LITHIUM" Lab Results  Component Value Date   VALPROATE 15 (L) 04/01/2023   VALPROATE 81 11/28/2021   No results found for: "CBMZ"  Current Medications: Current Outpatient Medications  Medication Sig Dispense Refill   OLANZapine (ZYPREXA) 10 MG tablet Take 1 tablet (10 mg total) by mouth at bedtime. 30 tablet 3   OLANZapine (ZYPREXA) 5 MG tablet Take 1 tablet (5 mg total) by mouth daily. 30 tablet 3   Amantadine HCl 100 MG tablet Take 1 tablet (100 mg total) by mouth daily. 30 tablet 3   cetirizine (ZYRTEC) 10 MG tablet Take 10 mg by mouth daily.     clonazePAM (KLONOPIN) 0.5 MG tablet Take 1 tablet (0.5 mg total) by mouth 2 (two) times daily as needed (agitation).     divalproex (DEPAKOTE ER) 500 MG 24 hr tablet Take 1 tablet  (500 mg total) by mouth 2 (two) times daily.     pantoprazole (PROTONIX) 40 MG tablet Take 40 mg by mouth daily.     propranolol (INDERAL) 10 MG tablet Take 1 tablet (10 mg total) by mouth 3 (three) times daily. 90 tablet 3   traZODone (DESYREL) 50 MG tablet Take 1-2 tablets (50-100 mg total) by mouth at bedtime. 60 tablet 3   zolpidem (AMBIEN) 5 MG tablet Take 1 tablet (5 mg total) by mouth at bedtime as needed for sleep. 30 tablet 1   No current facility-administered medications for this visit.     Musculoskeletal: Strength & Muscle Tone: decreased Gait & Station: unsteady Patient leans: N/A  Psychiatric Specialty Exam: Review of Systems  There were no vitals taken for this visit.There is no height or weight on file to calculate BMI.  General Appearance: Well Groomed  Eye Contact:  Fair  Speech:  Slow  Volume:  Decreased  Mood:  Anxious and Depressed  Affect:  Appropriate and Congruent  Thought Process:  Coherent, Goal Directed, and Linear  Orientation:  Full (Time, Place, and Person)  Thought Content: Hallucinations: Auditory   Suicidal Thoughts:  No  Homicidal Thoughts:  No  Memory:  Immediate;   Fair Recent;   Fair Remote;   Fair  Judgement:  Fair  Insight:  Fair  Psychomotor Activity:  Decreased  Concentration:  Concentration: Fair and Attention Span: Fair  Recall:  [employer] of Knowledge: Good  Language: Fair  Akathisia:  No  Handed:  Right  AIMS (if indicated): not done  Assets:  Communication Skills Desire for Improvement Financial Resources/Insurance Housing Leisure Time Physical Health Social Support  ADL's:  Intact  Cognition: WNL  Sleep:  Poor   Screenings: GAD-7    Flowsheet Row Office Visit from 04/04/2023 in [facility]  Total GAD-7 Score 11      PHQ2-9    Flowsheet Row Office Visit from 04/04/2023 in [facility]  PHQ-2 Total Score 1  PHQ-9 Total Score 13      Flowsheet Row ED  from 05/19/2023 in [facility] Most recent reading at 05/19/2023 12:52 PM ED from 03/31/2023 in [facility] Emergency Department at [facility] Most recent reading at 03/31/2023  5:09 PM ED from 03/31/2023 in [facility] Most recent reading at 03/31/2023  2:57 PM  C-SSRS RISK CATEGORY No Risk No Risk No Risk        Assessment and Plan: Today patient distractible, screamed out inappropriately, constantly pacing,.  He was seen with his brother who notes that he has not been sleeping more than 1 hour with Ambien and trazodone.  Provider spoke to Dr. Lucianne Muss who suggested discontinuing Invega and starting Zyprexa.  Today Zyprexa 5 mg daily and  Zyprexa 10 mg nightly ordered.  Invega discontinued.  Patient will continue all other medications as prescribed.   1. Bipolar 1 disorder (HCC)  Start- OLANZapine (ZYPREXA) 5 MG tablet; Take 1 tablet (5 mg total) by mouth daily.  Dispense: 30 tablet; Refill: 3 [facility]- OLANZapine (ZYPREXA) 10 MG tablet; Take 1 tablet (10 mg total) by mouth at bedtime.  Dispense: 30 tablet; Refill: 3   Collaboration of Care: Collaboration of Care: Other provider involved in patient's care AEB PCP  Patient/Guardian was advised Release of Information must be obtained prior to any record release in order to collaborate their care with an outside provider. Patient/Guardian was advised if they have not already done so to contact the registration department to sign all necessary forms in order for [place] to release information regarding their care.   Consent: Patient/Guardian gives verbal consent for treatment and assignment of benefits for services provided during this visit. Patient/Guardian expressed understanding and agreed to proceed.   Follow-up in 2 months Shanna Cisco, NP 05/19/2023, 3:36 PM

## 2023-06-21 ENCOUNTER — Telehealth (HOSPITAL_COMMUNITY): Payer: Medicaid Other | Admitting: Psychiatry

## 2023-06-21 ENCOUNTER — Encounter (HOSPITAL_COMMUNITY): Payer: Self-pay

## 2023-07-14 ENCOUNTER — Encounter (HOSPITAL_COMMUNITY): Payer: Self-pay | Admitting: Psychiatry

## 2023-07-14 ENCOUNTER — Telehealth (HOSPITAL_COMMUNITY): Payer: MEDICAID | Admitting: Psychiatry

## 2023-07-14 DIAGNOSIS — F411 Generalized anxiety disorder: Secondary | ICD-10-CM | POA: Diagnosis not present

## 2023-07-14 DIAGNOSIS — F319 Bipolar disorder, unspecified: Secondary | ICD-10-CM | POA: Diagnosis not present

## 2023-07-14 MED ORDER — OLANZAPINE 10 MG PO TABS: 10.0000 mg | ORAL_TABLET | Freq: Two times a day (BID) | ORAL | 3 refills | Status: DC

## 2023-07-14 MED ORDER — PROPRANOLOL HCL 10 MG PO TABS: 10.0000 mg | ORAL_TABLET | Freq: Two times a day (BID) | ORAL | 3 refills | Status: DC

## 2023-07-14 MED ORDER — ZOLPIDEM TARTRATE 5 MG PO TABS
5.0000 mg | ORAL_TABLET | Freq: Every evening | ORAL | 1 refills | Status: DC | PRN
Start: 2023-07-14 — End: 2023-09-07

## 2023-07-14 MED ORDER — DIVALPROEX SODIUM ER 500 MG PO TB24
500.0000 mg | ORAL_TABLET | Freq: Two times a day (BID) | ORAL | Status: DC
Start: 2023-07-14 — End: 2023-07-15

## 2023-07-14 NOTE — Progress Notes (Signed)
BH MD/PA/NP OP Progress Note Virtual Visit via Video Note  I connected with Phillip [name] on 07/14/23 at  2:30 PM EDT by a video enabled telemedicine application and verified that I am speaking with the correct person using two identifiers.  Location: Patient: Home Provider: Clinic   I discussed the limitations of evaluation and management by telemedicine and the availability of in person appointments. The patient expressed understanding and agreed to proceed.  I provided 30 minutes of non-face-to-face time during this encounter.   07/14/2023 3:41 PM Phillip [name]  MRN:  [id number]  Chief Complaint: "I hear things sometimes" Per mother "He is sleeping go much" Per brother "He is activated between 2-3 weeks"  HPI: 22 year old male seen today for follow up psychiatric evaluation.  He has a psychiatric history of Bipolar disorder and possible IDD. He is currently managed on Zyprexa 5 mg daily, Zyprexa 10 mg nightly, Ambien 5 mg, Depakote 500 mg twice daily, propranolol 10 mg three times daily, Amantadine 100 mg daily, and trazodone 50-100 mg nightly.  He notes his medications are somewhat effective in managing his psychiatric conditions.   Today provider utilizes Arabic interpreter as this is patient's primary language.  During the exam patient he was well groomed, pleasant, cooperative and engaged in conversation. He informed Clinical research associate that at times he hears voices speaking to him. He notes that it occurs at night and repeats what he did through the day.  He denies visual hallucinations or paranoia.  Since his last visit he informed writer that his anxiety and depression has been well-managed.  Patient notes that he would like to learn [place].  He however reports that he does not wish to go to a [employer] or college.  He asked Clinical research associate if he knew of anyone who could come into his home.  Provider informed patient that she was unaware of such resources but did inform him of [employer]. His mother reports that she would look into it.    Patient was seen with his mother who notes that he has been sleeping a lot.  Patient's brother was also present during exam.  He informed writer that the patient is activated between 2 to 3 weeks.  Provider asked patient and his family to elaborate.  Patient's brother notes that he has episodes where he has high energy, fluctuations in mood, irritability, and has little need for sleep.  During this time he reports that he sleeps 3 to 4 hours nightly.  Patient's brother described him as being 2 different people and notes that they just want him to be 1 person.  They informed provider that when he is not activated he sleeps over 12 hours.  Since adding Zyprexa 5 mg daily they note that he is more manageable.  They deny him trying to leave the home or engage in bizarre behaviors.   Today Zyprexa 5 mg increased to 10 mg daily.  Zyprexa 10 mg nightly will be continued.  Patient's brother reports that he has only been taking propranolol twice daily instead of 3 times daily.  Today propranolol reduced from 10 three times daily to 10 mg twice.  He will continue all other medications as prescribed.  Provider encouraged patient and family to only take trazodone and Ambien as needed.  They endorsed understanding and agreed.  No other concerns noted at this time.       Visit Diagnosis:    ICD-10-CM   1. Bipolar 1 disorder (HCC)  F31.9 divalproex (  DEPAKOTE ER) 500 MG 24 hr tablet    OLANZapine (ZYPREXA) 10 MG tablet    propranolol (INDERAL) 10 MG tablet    zolpidem (AMBIEN) 5 MG tablet    2. Generalized anxiety disorder  F41.1 propranolol (INDERAL) 10 MG tablet       Past Psychiatric History: Bipolar disorder and possible IDD   Past Medical History:  Past Medical History:  Diagnosis Date   Bipolar 1 disorder (HCC)    No past surgical history on file.  Family Psychiatric History:  Brother depression   Family History: No family  history on file.  Social History:  Social History   Socioeconomic History   Marital status: Single    Spouse name: Not on file   Number of children: Not on file   Years of education: Not on file   Highest education level: Not on file  Occupational History   Not on file  Tobacco Use   Smoking status: Never   Smokeless tobacco: Never  Substance and Sexual Activity   Alcohol use: Not Currently   Drug use: Not Currently   Sexual activity: Not on file  Other Topics Concern   Not on file  Social History Narrative   Not on file   Social Determinants of Health   Financial Resource Strain: Not on file  Food Insecurity: No Food Insecurity (11/29/2022)   [facility] prior to [facility]/26/2023[facility]   Hunger Vital Sign    Worried About Running Out of Food in the Last Year: Never true    Ran Out of Food in the Last Year: Never true  Transportation Needs: No Transportation Needs (11/29/2022)   [facility] from [employer] visits prior to 02/26/2023., [facility]   PRAPARE - Transportation    Lack of Transportation (Medical): No    Lack of Transportation (Non-Medical): No  Physical Activity: Not on file  Stress: Not on file  Social Connections: Unknown (03/28/2023)   [facility], [facility]   Social Network    Social Network: Not on file    Allergies: No Known Allergies  Metabolic Disorder Labs: No results found for: "HGBA1C", "MPG" No results found for: "PROLACTIN" No results found for: "CHOL", "TRIG", "HDL", "CHOLHDL", "VLDL", "LDLCALC" No results found for: "TSH"  Therapeutic Level Labs: No results found for: "LITHIUM" Lab Results  Component Value Date   VALPROATE 15 (L) 04/01/2023   VALPROATE 81 11/28/2021   No results found for: "CBMZ"  Current Medications: Current Outpatient Medications  Medication Sig Dispense Refill   Amantadine HCl 100 MG tablet Take  1 tablet (100 mg total) by mouth daily. 30 tablet 3   cetirizine (ZYRTEC) 10 MG tablet Take 10 mg by mouth daily.     clonazePAM (KLONOPIN) 0.5 MG tablet Take 1 tablet (0.5 mg total) by mouth 2 (two) times daily as needed (agitation).     divalproex (DEPAKOTE ER) 500 MG 24 hr tablet Take 1 tablet (500 mg total) by mouth 2 (two) times daily.     OLANZapine (ZYPREXA) 10 MG tablet Take 1 tablet (10 mg total) by mouth 2 (two) times daily. 60 tablet 3   pantoprazole (PROTONIX) 40 MG tablet Take 40 mg by mouth daily.     propranolol (INDERAL) 10 MG tablet Take 1 tablet (10 mg total) by mouth 2 (two) times daily. 60 tablet 3   zolpidem (AMBIEN) 5 MG tablet Take 1 tablet (5 mg  total) by mouth at bedtime as needed for sleep. 30 tablet 1   No current facility-administered medications for this visit.     Musculoskeletal: Strength & Muscle Tone: decreased Gait & Station: unsteady Patient leans: N/A  Psychiatric Specialty Exam: Review of Systems  There were no vitals taken for this visit.There is no height or weight on file to calculate BMI.  General Appearance: Well Groomed  Eye Contact:  Fair  Speech:  Slow  Volume:  Decreased  Mood:  Anxious and Depressed  Affect:  Appropriate and Congruent  Thought Process:  Coherent, Goal Directed, and Linear  Orientation:  Full (Time, Place, and Person)  Thought Content: Hallucinations: Auditory   Suicidal Thoughts:  No  Homicidal Thoughts:  No  Memory:  Immediate;   Fair Recent;   Fair Remote;   Fair  Judgement:  Good  Insight:  Good  Psychomotor Activity:  Decreased  Concentration:  Concentration: Good and Attention Span: Good  Recall:  Good  Fund of Knowledge: Good  Language: Fair  Akathisia:  No  Handed:  Right  AIMS (if indicated): not done  Assets:  Communication Skills Desire for Improvement Financial Resources/Insurance Housing Leisure Time Physical Health Social Support  ADL's:  Intact  Cognition: WNL  Sleep:  Fair    Screenings: GAD-7    Flowsheet Row Office Visit from 04/04/2023 in [facility]  Total GAD-7 Score 11      PHQ2-9    Flowsheet Row Office Visit from 04/04/2023 in [facility]  PHQ-2 Total Score 1  PHQ-9 Total Score 13      Flowsheet Row ED from 05/19/2023 in [facility] Most recent reading at 05/19/2023 12:52 PM ED from 03/31/2023 in [facility] Department at [facility] Most recent reading at 03/31/2023  5:09 PM ED from 03/31/2023 in [facility] Most recent reading at 03/31/2023  2:57 PM  C-SSRS RISK CATEGORY No Risk No Risk No Risk        Assessment and Plan: Today patient reports that his anxiety and depression has improved.  Patient was seen with his family he notes that every 2 to 3 weeks he experiences activation.  He described patient being hypomanic.  He has fluctuations in mood, irritability, impulsiveness, and little need for sleep. Today Zyprexa 5 mg increased to 10 mg daily.  Zyprexa 10 mg nightly will be continued.  Patient's brother reports that he has only been taking propranolol twice daily instead of 3 times daily.  Today propranolol reduced from 10 three times daily to 10 mg twice.  He will continue all other medications as prescribed.  Provider encouraged patient and family to only take trazodone and Ambien as needed.  They endorsed understanding and agreed.  1. Bipolar 1 disorder (HCC)  Continue- divalproex (DEPAKOTE ER) 500 MG 24 hr tablet; Take 1 tablet (500 mg total) by mouth 2 (two) times daily. Increased- OLANZapine (ZYPREXA) 10 MG tablet; Take 1 tablet (10 mg total) by mouth 2 (two) times daily.  Dispense: 60 tablet; Refill: 3 Reduced- propranolol (INDERAL) 10 MG tablet; Take 1 tablet (10 mg total) by mouth 2 (two) times daily.  Dispense: 60 tablet; Refill: 3 Continue- zolpidem (AMBIEN) 5 MG tablet; Take 1 tablet (5 mg total) by mouth at  bedtime as needed for sleep.  Dispense: 30 tablet; Refill: 1  2. Generalized anxiety disorder  Reduced- propranolol (INDERAL) 10 MG tablet; Take 1 tablet (10 mg total) by  mouth 2 (two) times daily.  Dispense: 60 tablet; Refill: 3   Collaboration of Care: Collaboration of Care: Other provider involved in patient's care AEB PCP  Patient/Guardian was advised Release of Information must be obtained prior to any record release in order to collaborate their care with an outside provider. Patient/Guardian was advised if they have not already done so to contact the registration department to sign all necessary forms in order for [place] to release information regarding their care.   Consent: Patient/Guardian gives verbal consent for treatment and assignment of benefits for services provided during this visit. Patient/Guardian expressed understanding and agreed to proceed.   Follow-up in 2 months Shanna Cisco, NP 07/14/2023, 3:41 PM

## 2023-07-15 ENCOUNTER — Other Ambulatory Visit (HOSPITAL_COMMUNITY): Payer: Self-pay

## 2023-07-15 ENCOUNTER — Telehealth (HOSPITAL_COMMUNITY): Payer: Self-pay

## 2023-07-15 DIAGNOSIS — F319 Bipolar disorder, unspecified: Secondary | ICD-10-CM

## 2023-07-15 MED ORDER — DIVALPROEX SODIUM ER 500 MG PO TB24
500.0000 mg | ORAL_TABLET | Freq: Two times a day (BID) | ORAL | 2 refills | Status: DC
Start: 2023-07-15 — End: 2023-09-07

## 2023-07-15 NOTE — Telephone Encounter (Signed)
Patient called about his Zolpidem that needs a prior auth, I told patient that it was started today on CMM. He then stated that the Depakote was not at the pharmacy - I reviewed the prescription and it was not sent, it was set to "no print". I resent the medication for 30 days with 2 refills.

## 2023-08-04 ENCOUNTER — Telehealth (HOSPITAL_COMMUNITY): Payer: Self-pay

## 2023-08-04 NOTE — Telephone Encounter (Signed)
Medication management - Prior authorization for patient's prescribed Olanzapine 10 mg, one 2 times a days, #60 submitted online with CoverMyMeds and sent to [employer] [place].  Medication approved with PA case [id number] good until 08/03/24. Called patient's [facility] to inform patient's Olanzapine 10 mg was approved and could be filled at this time. Pharmacist verified the medication was being filled as prescribed.

## 2023-09-06 ENCOUNTER — Telehealth (HOSPITAL_COMMUNITY): Payer: Self-pay | Admitting: *Deleted

## 2023-09-06 NOTE — Telephone Encounter (Signed)
Patient called asking for refill of Depakote ER. Chart reviewed and it was too early. Called pharmacy and was told it was filled on 7/20 for a 90 day supply and will not need a refill until October. Notified patient.

## 2023-09-07 ENCOUNTER — Telehealth (HOSPITAL_COMMUNITY): Payer: Self-pay | Admitting: *Deleted

## 2023-09-07 ENCOUNTER — Other Ambulatory Visit (HOSPITAL_COMMUNITY): Payer: Self-pay | Admitting: Psychiatry

## 2023-09-07 DIAGNOSIS — F319 Bipolar disorder, unspecified: Secondary | ICD-10-CM

## 2023-09-07 MED ORDER — DIVALPROEX SODIUM ER 500 MG PO TB24
500.0000 mg | ORAL_TABLET | Freq: Three times a day (TID) | ORAL | 3 refills | Status: DC
Start: 2023-09-07 — End: 2023-09-15

## 2023-09-07 MED ORDER — ZOLPIDEM TARTRATE 5 MG PO TABS
5.0000 mg | ORAL_TABLET | Freq: Every evening | ORAL | 1 refills | Status: DC | PRN
Start: 2023-09-07 — End: 2023-09-15

## 2023-09-07 MED ORDER — OLANZAPINE 10 MG PO TABS
10.0000 mg | ORAL_TABLET | Freq: Two times a day (BID) | ORAL | 3 refills | Status: DC
Start: 2023-09-07 — End: 2023-09-15

## 2023-09-07 NOTE — Telephone Encounter (Signed)
Patients brother reports that Mr. Nil has been more "activated". He notes that he is irritable, distracted, has fluctuations in mood, has increased energy, and is disrespectful to his mother. He reports that he stays activated for 9 days prior to calming down. He reports that he has been taking three 10 mg Zyprexa pills. Provider infomred him that 20mg  is usually the recommended max dose. He endorsed understanding. Provider reccommended increasing Depakote 500 BID to three times daily. He was agreeable to this. Patient brother also notes that a prior Berkley Harvey is needed to get a month supply of Ambien. Provider notified nursing staff. No other concerns noted at this time.

## 2023-09-07 NOTE — Telephone Encounter (Signed)
Patient called states he is out of medications, Depakote ER and Ambien. Explained that his pharmacy states he is not able to get another prescription of Depakote until October due to filling a 90 day prescription on 7/20. Patient states he does not have any more and needs his medication. Also states that his Ambien is suppose to be a higher dose. Message sent to MD for review.

## 2023-09-08 IMAGING — CT CT HEAD W/O CM
4 of 8 series · 17 of 47 positions shown, 19 images · non-contrast
Comparison: None.

CLINICAL DATA: Mental status change.

EXAM:
CT HEAD WITHOUT CONTRAST
TECHNIQUE: Contiguous axial images were obtained from the base of the skull
through the vertex without intravenous contrast.

[Series 2: head 5.0 h30s · axial · 0.41mm/px · z∈[-158,-83]mm · 4 of 26 slices shown (1 of 2)]
[im 6/26  brain]
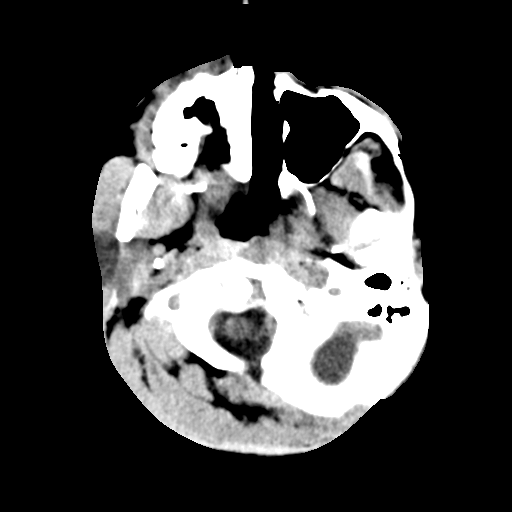
[im 11/26  brain]
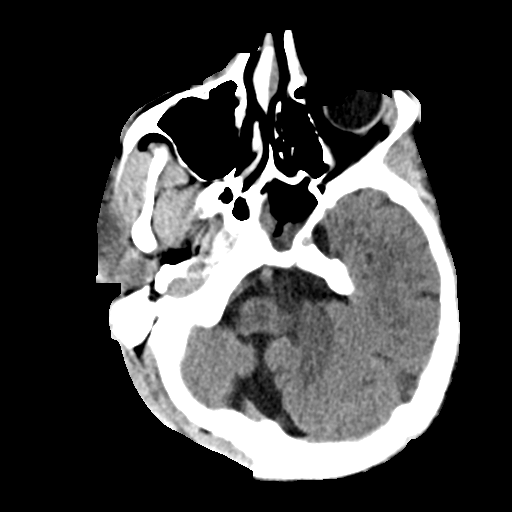
[im 16/26  brain]
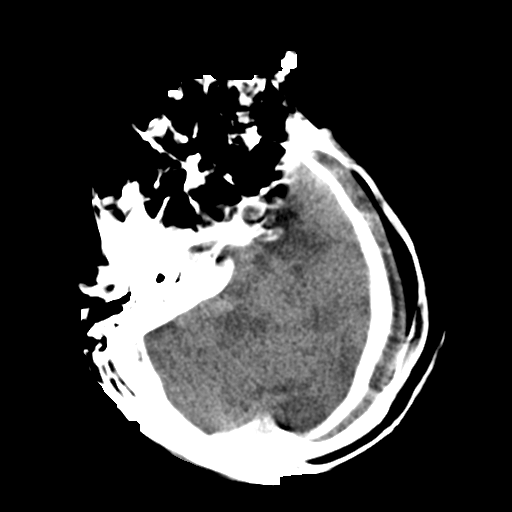
[im 21/26  brain]
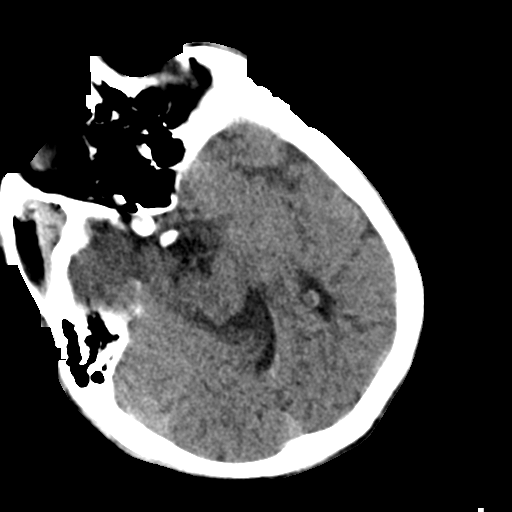

[Series 3: head 5.0 h30s · axial · 0.41mm/px · z∈[-163,-23]mm · 7 of 38 slices shown, 9 images (2 of 2)]
[im 5/38  brain]
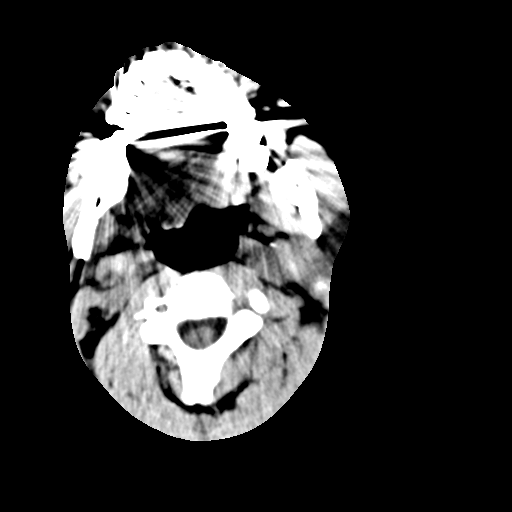
[im 5/38  bone]
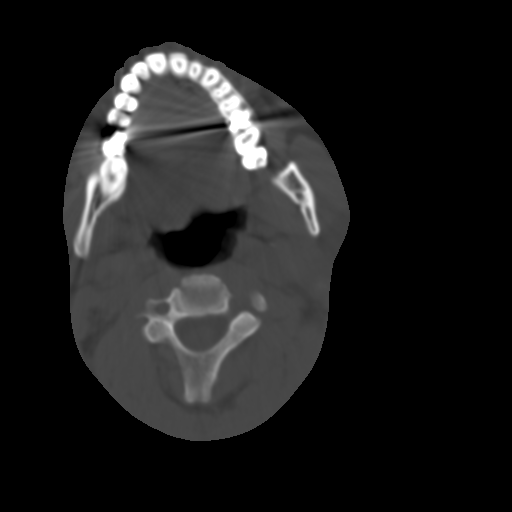
[im 10/38  brain]
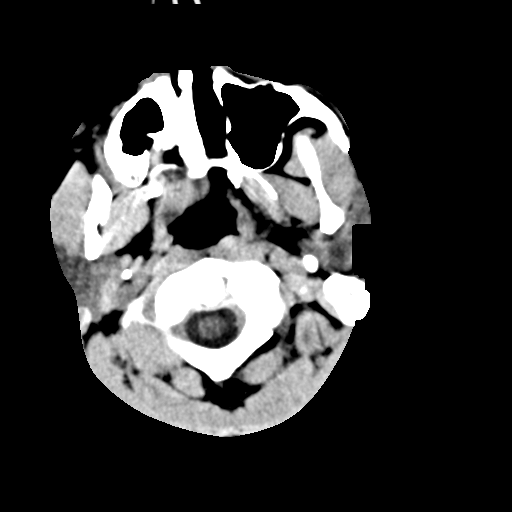
[im 14/38  brain]
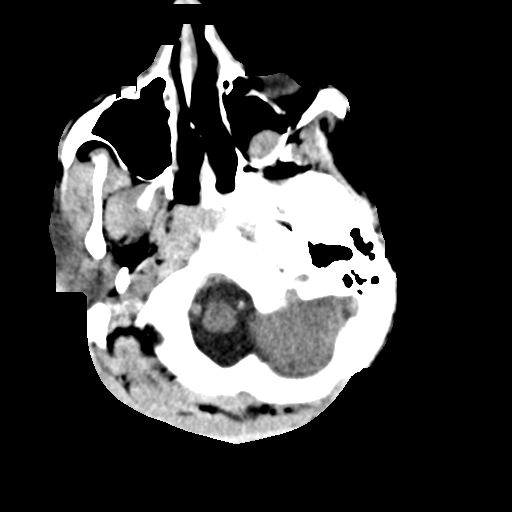
[im 19/38  brain]
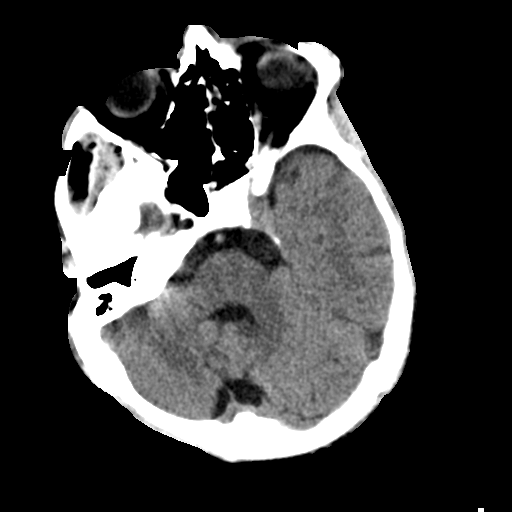
[im 24/38  brain]
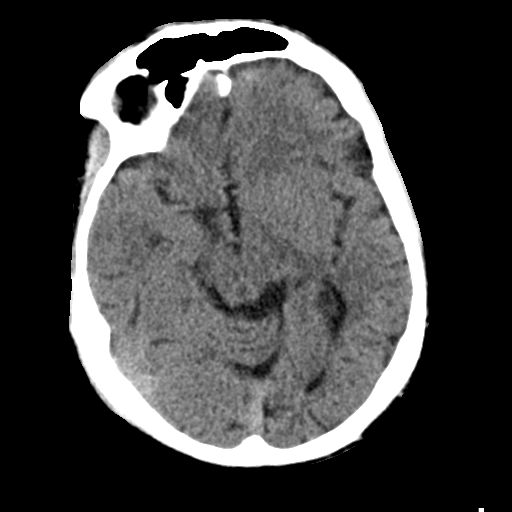
[im 24/38  bone]
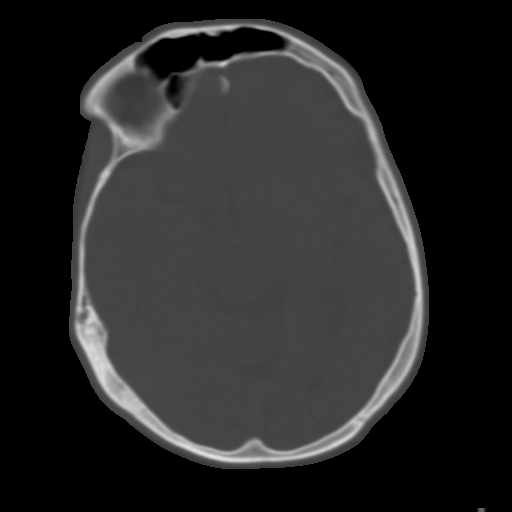
[im 28/38  brain]
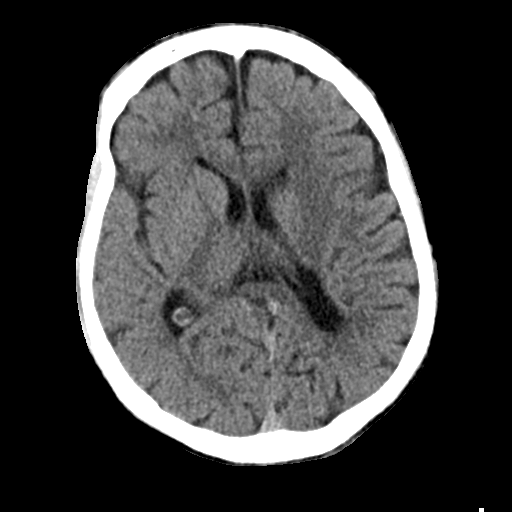
[im 33/38  brain]
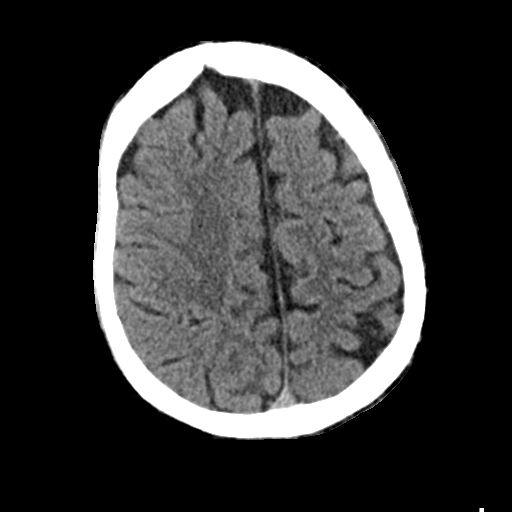

[Series 5: head 3.0 mpr cor · coronal · 0.39mm/px · 3 of 67 slices shown]
[im 17/67  brain]
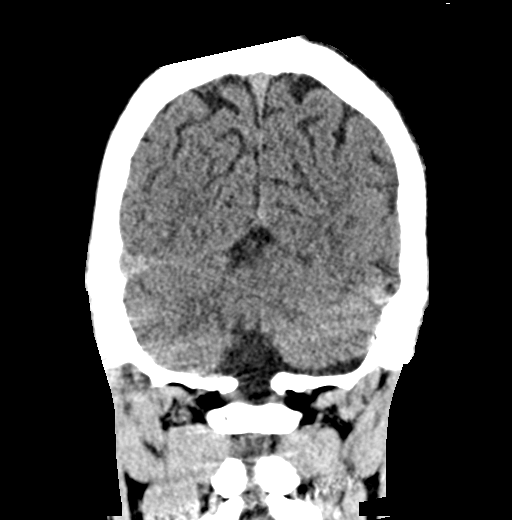
[im 34/67  brain]
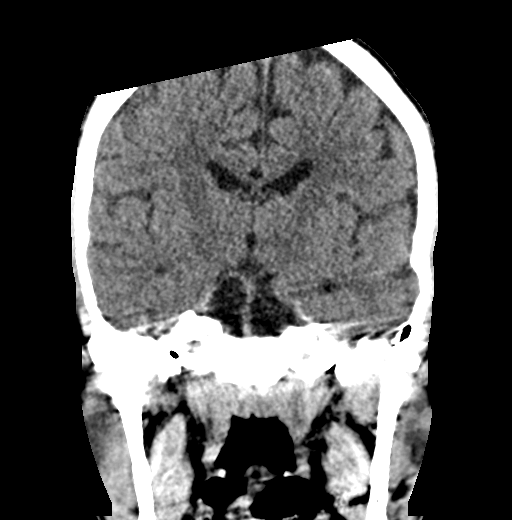
[im 50/67  brain]
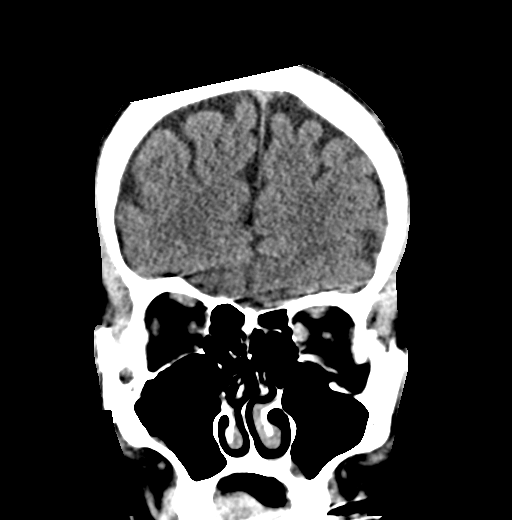

[Series 6: head 3.0 mpr sag · sagittal · 0.33mm/px · 3 of 57 slices shown]
[im 31/57  brain]
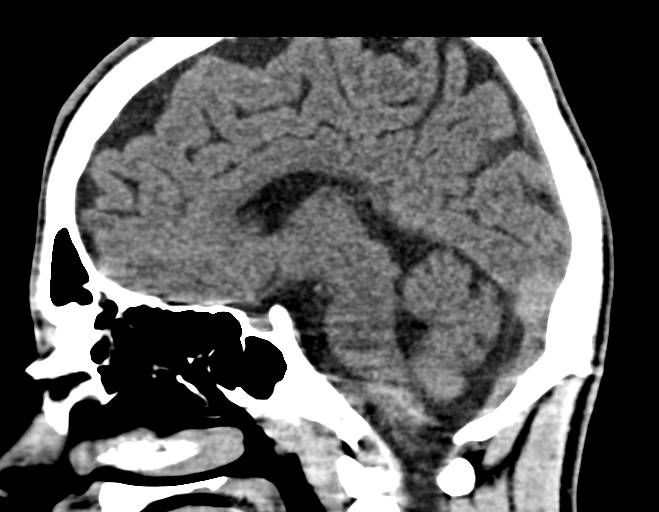
[im 39/57  brain]
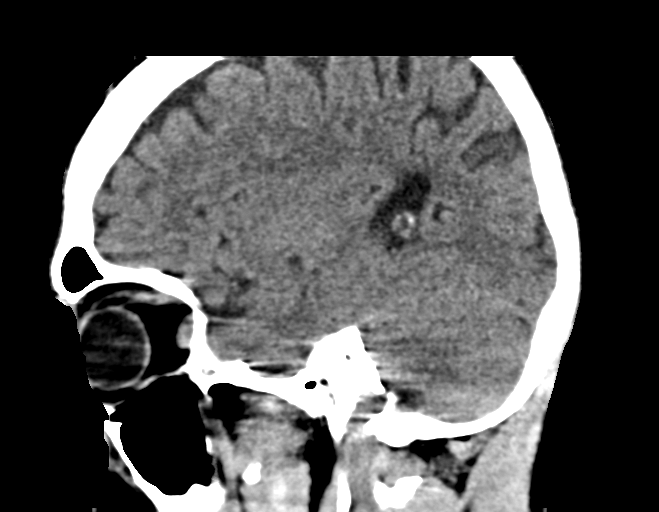
[im 48/57  brain]
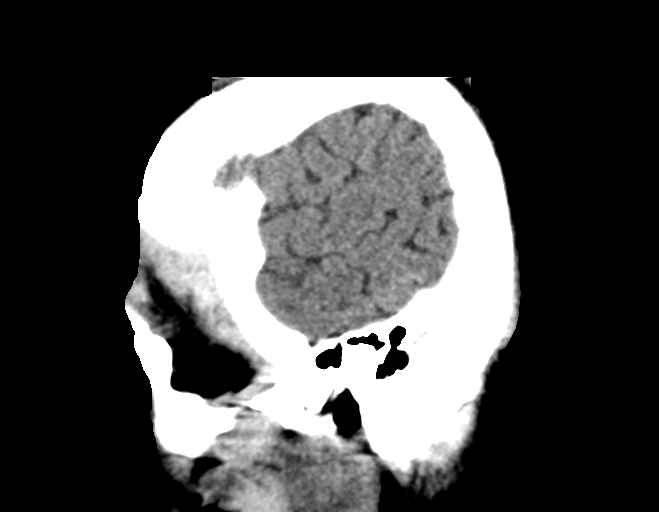

[17 of 47 positions shown; findings below may reference images not displayed]

FINDINGS: Brain: No evidence of acute infarction, hemorrhage, hydrocephalus,
extra-axial collection or mass lesion/mass effect.

Vascular: No hyperdense vessel or unexpected calcification.

Skull: Normal. Negative for fracture or focal lesion.

Sinuses/Orbits: Polypoid mucosal thickening of the left sphenoid
sinus, left maxillary sinus and ethmoid sinuses.

Other: None.
IMPRESSION: 1. No acute intracranial abnormality.
2. Chronic left sphenoid, left maxillary and ethmoid sinusitis.

## 2023-09-15 ENCOUNTER — Encounter (HOSPITAL_COMMUNITY): Payer: Self-pay | Admitting: Psychiatry

## 2023-09-15 ENCOUNTER — Telehealth (HOSPITAL_COMMUNITY): Payer: MEDICAID | Admitting: Psychiatry

## 2023-09-15 DIAGNOSIS — F319 Bipolar disorder, unspecified: Secondary | ICD-10-CM | POA: Diagnosis not present

## 2023-09-15 DIAGNOSIS — F411 Generalized anxiety disorder: Secondary | ICD-10-CM

## 2023-09-15 MED ORDER — PROPRANOLOL HCL 10 MG PO TABS
10.0000 mg | ORAL_TABLET | Freq: Two times a day (BID) | ORAL | 3 refills | Status: DC
Start: 2023-09-15 — End: 2023-10-11

## 2023-09-15 MED ORDER — OLANZAPINE 10 MG PO TABS
10.0000 mg | ORAL_TABLET | Freq: Two times a day (BID) | ORAL | 3 refills | Status: DC
Start: 2023-09-15 — End: 2023-10-11

## 2023-09-15 MED ORDER — DIVALPROEX SODIUM ER 500 MG PO TB24
500.0000 mg | ORAL_TABLET | Freq: Three times a day (TID) | ORAL | 3 refills | Status: DC
Start: 2023-09-15 — End: 2023-10-11

## 2023-09-15 MED ORDER — ZOLPIDEM TARTRATE 5 MG PO TABS
10.0000 mg | ORAL_TABLET | Freq: Every evening | ORAL | 1 refills | Status: DC | PRN
Start: 2023-09-15 — End: 2023-10-11

## 2023-09-15 NOTE — Progress Notes (Signed)
BH MD/PA/NP OP Progress Note Virtual Visit via Video Note  I connected with Phillip [name] on 09/15/23 at  2:00 PM EDT by a video enabled telemedicine application and verified that I am speaking with the correct person using two identifiers.  Location: Patient: Home Provider: Clinic   I discussed the limitations of evaluation and management by telemedicine and the availability of in person appointments. The patient expressed understanding and agreed to proceed.  I provided 30 minutes of non-face-to-face time during this encounter.   09/15/2023 2:37 PM Minton Ravenscroft  MRN:  [id number]  Chief Complaint: "I am irritable because I did not sleep" Per mother "he becomes irritable when he does not sleep" Per brother "he goes from an active to not active mood"  HPI: 22 year old male seen today for follow up psychiatric evaluation.  He has a psychiatric history of Bipolar disorder and possible IDD. He is currently managed on  Zyprexa 10 mg twice daily, Ambien 5 mg, Depakote 500 mg three times daily (recently increased), propranolol 10 mg three times daily, and trazodone 50-100 mg nightly.  He notes his medications are somewhat effective in managing his psychiatric conditions.   Today provider utilizes Arabic interpreter as this is patient's primary language.  During the exam patient he was well groomed, pleasant, cooperative and engaged in conversation. He informed Clinical research associate that today he is more irritable because of lack of sleep. Patient notes that he hs been sleeping 2-3 hours nightly. Patient mothers notes that when he has poor sleep he tries to leave the home and becomes more irritable.  She reports that his voice becomes agitated.  Patient brother notes that he is disrespectful, has fluctuations in mood, increased irritability, and racing thoughts.  He reports that he goes from being activated to not activated.  He notes that he feels as if he is living with 2 different people.  Patient's brother   reports that they have not increased his Depakote to 3 times daily.  Provider recommended this dose increase.  They endorsed understanding and agreed. Today he denies SI/HI/VH, or paranoia.  Patient asked writer if he will have to build medication for the rest of his life.  Provider encouraged patient to continue medication and notes that in the future when he is more stable his medication could be tapered and potentially discontinued. Today Ambien increased from 5 mg to 10 mg to help manage sleep.  Patient will start taking Depakote 500 mg 3 times daily to help manage mood.  He will continue all other medications as prescribed.      Visit Diagnosis:    ICD-10-CM   1. Bipolar 1 disorder (HCC)  F31.9 zolpidem (AMBIEN) 5 MG tablet    propranolol (INDERAL) 10 MG tablet    OLANZapine (ZYPREXA) 10 MG tablet    divalproex (DEPAKOTE ER) 500 MG 24 hr tablet    2. Generalized anxiety disorder  F41.1 propranolol (INDERAL) 10 MG tablet        Past Psychiatric History: Bipolar disorder and possible IDD   Past Medical History:  Past Medical History:  Diagnosis Date   Bipolar 1 disorder (HCC)    No past surgical history on file.  Family Psychiatric History:  Brother depression   Family History: No family history on file.  Social History:  Social History   Socioeconomic History   Marital status: Single    Spouse name: Not on file   Number of children: Not on file   Years of education: Not on file  Highest education level: Not on file  Occupational History   Not on file  Tobacco Use   Smoking status: Never   Smokeless tobacco: Never  Substance and Sexual Activity   Alcohol use: Not Currently   Drug use: Not Currently   Sexual activity: Not on file  Other Topics Concern   Not on file  Social History Narrative   Not on file   Social Determinants of Health   Financial Resource Strain: Not on file  Food Insecurity: No Food Insecurity (11/29/2022)   [facility] visits prior to [facility]26/[facility]23[facility]   Hunger Vital Sign    Worried About Running Out of Food in the Last Year: Never true    Ran Out of Food in the Last Year: Never true  Transportation Needs: No Transportation Needs (11/29/2022)   [facility] [employer], [facility] prior to [facility]23., [facility] visits prior to 02/26/2023., [facility]   PRAPARE - Transportation    Lack of Transportation (Medical): No    Lack of Transportation (Non-Medical): No  Physical Activity: Not on file  Stress: Not on file  Social Connections: Unknown (03/28/2023)   [facility] from [facility], [facility]   Social Network    Social Network: Not on file    Allergies: No Known Allergies  Metabolic Disorder Labs: No results found for: "HGBA1C", "MPG" No results found for: "PROLACTIN" No results found for: "CHOL", "TRIG", "HDL", "CHOLHDL", "VLDL", "LDLCALC" No results found for: "TSH"  Therapeutic Level Labs: No results found for: "LITHIUM" Lab Results  Component Value Date   VALPROATE 15 (L) 04/01/2023   VALPROATE 81 11/28/2021   No results found for: "CBMZ"  Current Medications: Current Outpatient Medications  Medication Sig Dispense Refill   Amantadine HCl 100 MG tablet Take 1 tablet (100 mg total) by mouth daily. 30 tablet 3   cetirizine (ZYRTEC) 10 MG tablet Take 10 mg by mouth daily.     clonazePAM (KLONOPIN) 0.5 MG tablet Take 1 tablet (0.5 mg total) by mouth 2 (two) times daily as needed (agitation).     divalproex (DEPAKOTE ER) 500 MG 24 hr tablet Take 1 tablet (500 mg total) by mouth 3 (three) times daily. 90 tablet 3   OLANZapine (ZYPREXA) 10 MG tablet Take 1 tablet (10 mg total) by mouth 2 (two) times daily. 60 tablet 3   pantoprazole (PROTONIX) 40 MG tablet Take 40 mg by mouth daily.     propranolol (INDERAL) 10 MG tablet Take 1 tablet  (10 mg total) by mouth 2 (two) times daily. 60 tablet 3   zolpidem (AMBIEN) 5 MG tablet Take 2 tablets (10 mg total) by mouth at bedtime as needed for sleep. 60 tablet 1   No current facility-administered medications for this visit.     Musculoskeletal: Strength & Muscle Tone: decreased Gait & Station: unsteady Patient leans: N/A  Psychiatric Specialty Exam: Review of Systems  There were no vitals taken for this visit.There is no height or weight on file to calculate BMI.  General Appearance: Well Groomed  Eye Contact:  Fair  Speech:  Normal Rate  Volume:  Normal  Mood:  Anxious  Affect:  Appropriate and Congruent  Thought Process:  Coherent, Goal Directed, and Linear  Orientation:  Full (Time, Place, and Person)  Thought Content: WDL and Logical   Suicidal Thoughts:  No  Homicidal  Thoughts:  No  Memory:  Immediate;   Good Recent;   Good Remote;   Good  Judgement:  Good  Insight:  Good  Psychomotor Activity:  Decreased  Concentration:  Concentration: Good and Attention Span: Good  Recall:  Good  Fund of Knowledge: Good  Language: Fair  Akathisia:  No  Handed:  Right  AIMS (if indicated): not done  Assets:  Communication Skills Desire for Improvement Financial Resources/Insurance Housing Leisure Time Physical Health Social Support  ADL's:  Intact  Cognition: WNL  Sleep:  Fair   Screenings: GAD-7    Flowsheet Row Office Visit from 04/04/2023 in [facility]  Total GAD-7 Score 11      PHQ2-9    Flowsheet Row Office Visit from 04/04/2023 in [place]  PHQ-2 Total Score 1  PHQ-9 Total Score 13      Flowsheet Row ED from 05/19/2023 in [facility] Most recent reading at 05/19/2023 12:52 PM ED from 03/31/2023 in [facility] Emergency Department at [facility] Most recent reading at 03/31/2023  5:09 PM ED from 03/31/2023 in [facility] Most  recent reading at 03/31/2023  2:57 PM  C-SSRS RISK CATEGORY No Risk No Risk No Risk        Assessment and Plan: Today patient reports that his anxiety and depression has improved.  He states his sleep is poor.  Patient family reports that when he sleeps 2 to 3 hours nightly he becomes more active and irritable.  Patient asked writer if he will have to build medication for the rest of his life.  Provider encouraged patient to continue medication and notes that in the future when he is more stable his medication could be tapered and potentially discontinued. Today Ambien increased from 5 mg to 10 mg to help manage sleep.  Patient will start taking Depakote 500 mg 3 times daily to help manage mood.  He will continue all other medications as prescribed  1. Bipolar 1 disorder (HCC)  Increased- zolpidem (AMBIEN) 5 MG tablet; Take 2 tablets (10 mg total) by mouth at bedtime as needed for sleep.  Dispense: 60 tablet; Refill: 1 Continue- propranolol (INDERAL) 10 MG tablet; Take 1 tablet (10 mg total) by mouth 2 (two) times daily.  Dispense: 60 tablet; Refill: 3 Continue- OLANZapine (ZYPREXA) 10 MG tablet; Take 1 tablet (10 mg total) by mouth 2 (two) times daily.  Dispense: 60 tablet; Refill: 3 Increased- divalproex (DEPAKOTE ER) 500 MG 24 hr tablet; Take 1 tablet (500 mg total) by mouth 3 (three) times daily.  Dispense: 90 tablet; Refill: 3  2. Generalized anxiety disorder  Continue- propranolol (INDERAL) 10 MG tablet; Take 1 tablet (10 mg total) by mouth 2 (two) times daily.  Dispense: 60 tablet; Refill: 3   Collaboration of Care: Collaboration of Care: Other provider involved in patient's care AEB PCP  Patient/Guardian was advised Release of Information must be obtained prior to any record release in order to collaborate their care with an outside provider. Patient/Guardian was advised if they have not already done so to contact the registration department to sign all necessary forms in order for [place] to  release information regarding their care.   Consent: Patient/Guardian gives verbal consent for treatment and assignment of benefits for services provided during this visit. Patient/Guardian expressed understanding and agreed to proceed.   Follow-up in 2 months Shanna Cisco, NP 09/15/2023, 2:37 PM

## 2023-09-27 ENCOUNTER — Telehealth (HOSPITAL_COMMUNITY): Payer: Self-pay | Admitting: *Deleted

## 2023-09-27 NOTE — Telephone Encounter (Signed)
Patients brother called states that Ambien needs a prior authorization for Ambien. Called [facility] spoke with Desiree who gave approval until 03/25/24. Reference #[id number]. Called to notify pharmacy. Patients brother also requesting refill of Amantadine 100mg .

## 2023-09-28 NOTE — Telephone Encounter (Signed)
Amantadine was filled once by provider.  Patient received this medication in the past from another provider.  He does not have abnormal muscle movements, Parkinson's, or influenza.  Provider informed patient and his brother that this medication would not be refilled.  He endorsed understanding and agreed.  Patient's Ambien as prior authorization was approved.  Patient was notified.

## 2023-10-11 ENCOUNTER — Telehealth (INDEPENDENT_AMBULATORY_CARE_PROVIDER_SITE_OTHER): Payer: MEDICAID | Admitting: Psychiatry

## 2023-10-11 ENCOUNTER — Encounter (HOSPITAL_COMMUNITY): Payer: Self-pay | Admitting: Psychiatry

## 2023-10-11 DIAGNOSIS — F411 Generalized anxiety disorder: Secondary | ICD-10-CM

## 2023-10-11 DIAGNOSIS — F319 Bipolar disorder, unspecified: Secondary | ICD-10-CM | POA: Diagnosis not present

## 2023-10-11 MED ORDER — ZOLPIDEM TARTRATE 5 MG PO TABS
10.0000 mg | ORAL_TABLET | Freq: Every evening | ORAL | 1 refills | Status: DC | PRN
Start: 2023-10-11 — End: 2023-11-07

## 2023-10-11 MED ORDER — OLANZAPINE 10 MG PO TABS
10.0000 mg | ORAL_TABLET | Freq: Two times a day (BID) | ORAL | 3 refills | Status: DC
Start: 2023-10-11 — End: 2023-11-07

## 2023-10-11 MED ORDER — DIVALPROEX SODIUM ER 500 MG PO TB24
500.0000 mg | ORAL_TABLET | Freq: Three times a day (TID) | ORAL | 3 refills | Status: DC
Start: 2023-10-11 — End: 2023-11-07

## 2023-10-11 MED ORDER — PROPRANOLOL HCL 10 MG PO TABS
10.0000 mg | ORAL_TABLET | Freq: Two times a day (BID) | ORAL | 3 refills | Status: DC
Start: 2023-10-11 — End: 2023-11-07

## 2023-10-11 NOTE — Progress Notes (Signed)
BH MD/PA/NP OP Progress Note Virtual Visit via Video Note  I connected with Phillip [name] on 10/11/23 at  3:30 PM EDT by a video enabled telemedicine application and verified that I am speaking with the correct person using two identifiers.  Location: Patient: Home Provider: Clinic   I discussed the limitations of evaluation and management by telemedicine and the availability of in person appointments. The patient expressed understanding and agreed to proceed.  I provided 30 minutes of non-face-to-face time during this encounter.   10/11/2023 4:10 PM Phillip [name]  MRN:  [id number]  Chief Complaint: "I have been sleeping more" Per mother "His mood changes"   HPI: 22 year old male seen today for follow up psychiatric evaluation.  He has a psychiatric history of Bipolar disorder and possible IDD. He is currently managed on  Zyprexa 10 mg twice daily, Ambien 10 mg, Depakote 500 mg three times daily (reports takes it twice daily), propranolol 10 mg three times daily, and trazodone 50-100 mg nightly.  He notes his medications are effective in managing his psychiatric conditions.   Today provider utilizes Arabic interpreter as this is patient's primary language.  During the exam patient he was well groomed, pleasant, cooperative and engaged in conversation. He informed Clinical research associate that he has been sleeping more. 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 Provider reccommended only taking Ambien as needed and taking his Depakote nightly. He and his mother endorsed understanding and agreed. At patients last visit he was only sleeping 2-3 hours. Patient describes being irritable, angry at himself at times, and notes that his mood fluctuated.   Patient was seen with his mother who notes that his mood changes. She reports that she does not feel that he is stable. She reports that at times he sleeps and lacks motivation to do things and then other times he is active  and wants to get up and leave. She notes that he is less irritable and not disrespectful. She notes that she has only been taking two doses of Depakote as he is sleeping in the day. Provider recommended the dose increase to 1500 mg daily.  They endorsed understanding and agreed. Today he denies SI/HI/VAH, or paranoia.  Patient and his mother notes that for the past two months he has been experiencing muscle spasms in his leg after rising from the floor. Patient notes that it happens intermittently. He denies current pain today. Provider recommenced patient following up with his PCP to address this. He endorsed understanding and agreed.   Today patient will start taking Depakote 500 mg 3 times daily to help manage mood.  He will continue all other medications as prescribed. No other concerns noted at this time. No other concerns noted at this.       Visit Diagnosis:    ICD-10-CM   1. Bipolar 1 disorder (HCC)  F31.9 divalproex (DEPAKOTE ER) 500 MG 24 hr tablet    OLANZapine (ZYPREXA) 10 MG tablet    propranolol (INDERAL) 10 MG tablet    zolpidem (AMBIEN) 5 MG tablet    2. Generalized anxiety disorder  F41.1 propranolol (INDERAL) 10 MG tablet         Past Psychiatric History: Bipolar disorder and possible IDD   Past Medical History:  Past Medical History:  Diagnosis Date   Bipolar 1 disorder (HCC)    History reviewed. No pertinent surgical history.  Family Psychiatric History:  Brother depression   Family History: History reviewed. No pertinent family history.  Social History:  Social History   Socioeconomic History   Marital status: Single    Spouse name: Not on file   Number of children: Not on file   Years of education: Not on file   Highest education level: Not on file  Occupational History   Not on file  Tobacco Use   Smoking status: Never   Smokeless tobacco: Never  Substance and Sexual Activity   Alcohol use: Not Currently   Drug use: Not Currently   Sexual  activity: Not on file  Other Topics Concern   Not on file  Social History Narrative   Not on file   Social Determinants of Health   Financial Resource Strain: Not on file  Food Insecurity: No Food Insecurity (11/29/2022)   [facility] visits prior to 02/26/2023., [facility] visits prior to 02/26/2023., [facility]   Hunger Vital Sign    Worried About Running Out of Food in the Last Year: Never true    Ran Out of Food in the Last Year: Never true  Transportation Needs: No Transportation Needs (11/29/2022)   [facility] [employer], [facility] visits prior to 02/26/2023., [facility] visits prior to 02/26/2023., [facility]   PRAPARE - Transportation    Lack of Transportation (Medical): No    Lack of Transportation (Non-Medical): No  Physical Activity: Not on file  Stress: Not on file  Social Connections: Unknown (03/28/2023)   [facility], [facility]   Social Network    Social Network: Not on file    Allergies: No Known Allergies  Metabolic Disorder Labs: No results found for: "HGBA1C", "MPG" No results found for: "PROLACTIN" No results found for: "CHOL", "TRIG", "HDL", "CHOLHDL", "VLDL", "LDLCALC" No results found for: "TSH"  Therapeutic Level Labs: No results found for: "LITHIUM" Lab Results  Component Value Date   VALPROATE 15 (L) 04/01/2023   VALPROATE 81 11/28/2021   No results found for: "CBMZ"  Current Medications: Current Outpatient Medications  Medication Sig Dispense Refill   Amantadine HCl 100 MG tablet Take 1 tablet (100 mg total) by mouth daily. 30 tablet 3   cetirizine (ZYRTEC) 10 MG tablet Take 10 mg by mouth daily.     clonazePAM (KLONOPIN) 0.5 MG tablet Take 1 tablet (0.5 mg total) by mouth 2 (two) times daily as needed (agitation).     divalproex (DEPAKOTE ER) 500 MG 24 hr tablet Take 1 tablet (500 mg total) by mouth 3  (three) times daily. 90 tablet 3   OLANZapine (ZYPREXA) 10 MG tablet Take 1 tablet (10 mg total) by mouth 2 (two) times daily. 60 tablet 3   pantoprazole (PROTONIX) 40 MG tablet Take 40 mg by mouth daily.     propranolol (INDERAL) 10 MG tablet Take 1 tablet (10 mg total) by mouth 2 (two) times daily. 60 tablet 3   zolpidem (AMBIEN) 5 MG tablet Take 2 tablets (10 mg total) by mouth at bedtime as needed for sleep. 60 tablet 1   No current facility-administered medications for this visit.     Musculoskeletal: Strength & Muscle Tone: decreased and Telehealth visit Gait & Station: unsteady, Telehealth visit Patient leans: N/A  Psychiatric Specialty Exam: Review of Systems  There were no vitals taken for this visit.There is no height or weight on file to calculate BMI.  General Appearance: Well Groomed  Eye Contact:  Good  Speech:  Normal Rate  Volume:  Normal  Mood:  Euthymic  Affect:  Appropriate and Congruent  Thought Process:  Coherent, Goal Directed, and Linear  Orientation:  Full (Time, Place, and Person)  Thought Content: WDL and Logical   Suicidal Thoughts:  No  Homicidal Thoughts:  No  Memory:  Immediate;   Good Recent;   Good Remote;   Good  Judgement:  Good  Insight:  Good  Psychomotor Activity:  Decreased  Concentration:  Concentration: Good and Attention Span: Good  Recall:  Good  Fund of Knowledge: Good  Language: Good  Akathisia:  No  Handed:  Right  AIMS (if indicated): not done  Assets:  Communication Skills Desire for Improvement Financial Resources/Insurance Housing Leisure Time Physical Health Social Support  ADL's:  Intact  Cognition: WNL  Sleep:  Fair   Screenings: GAD-7    Flowsheet Row Office Visit from 04/04/2023 in [facility]  Total GAD-7 Score 11      PHQ2-9    Flowsheet Row Office Visit from 04/04/2023 in [place]  PHQ-2 Total Score 1  PHQ-9 Total Score 13       Flowsheet Row ED from 05/19/2023 in [facility] Most recent reading at 05/19/2023 12:52 PM ED from 03/31/2023 in [facility] Emergency Department at [facility] Most recent reading at 03/31/2023  5:09 PM ED from 03/31/2023 in [facility] Most recent reading at 03/31/2023  2:57 PM  C-SSRS RISK CATEGORY No Risk No Risk No Risk        Assessment and Plan: Today patient reports that at times he is irritable and moody. His mother also notes that he lacks motivation and has fluctuations in mood. She reports that he misses his afternoon dose of Depakote. Today patient will start taking Depakote 500 mg 3 times daily to help manage mood.  He will continue all other medications as prescribed.  1. Bipolar 1 disorder (HCC)  Continue/increased- divalproex (DEPAKOTE ER) 500 MG 24 hr tablet; Take 1 tablet (500 mg total) by mouth 3 (three) times daily.  Dispense: 90 tablet; Refill: 3 Continue- OLANZapine (ZYPREXA) 10 MG tablet; Take 1 tablet (10 mg total) by mouth 2 (two) times daily.  Dispense: 60 tablet; Refill: 3 Continue- propranolol (INDERAL) 10 MG tablet; Take 1 tablet (10 mg total) by mouth 2 (two) times daily.  Dispense: 60 tablet; Refill: 3 Continue- zolpidem (AMBIEN) 5 MG tablet; Take 2 tablets (10 mg total) by mouth at bedtime as needed for sleep.  Dispense: 60 tablet; Refill: 1  2. Generalized anxiety disorder  Continue- propranolol (INDERAL) 10 MG tablet; Take 1 tablet (10 mg total) by mouth 2 (two) times daily.  Dispense: 60 tablet; Refill: 3  Collaboration of Care: Collaboration of Care: Other provider involved in patient's care AEB PCP  Patient/Guardian was advised Release of Information must be obtained prior to any record release in order to collaborate their care with an outside provider. Patient/Guardian was advised if they have not already done so to contact the registration department to sign all necessary forms in order for  [place] to release information regarding their care.   Consent: Patient/Guardian gives verbal consent for treatment and assignment of benefits for services provided during this visit. Patient/Guardian expressed understanding and agreed to proceed.   Follow-up in 1 months Shanna Cisco, NP 10/11/2023, 4:10 PM

## 2023-10-19 ENCOUNTER — Telehealth (HOSPITAL_COMMUNITY): Payer: Self-pay | Admitting: *Deleted

## 2023-10-19 ENCOUNTER — Other Ambulatory Visit (HOSPITAL_COMMUNITY): Payer: Self-pay | Admitting: Psychiatry

## 2023-10-19 DIAGNOSIS — F319 Bipolar disorder, unspecified: Secondary | ICD-10-CM

## 2023-10-19 MED ORDER — UNISOM SLEEPMELTS 25 MG PO TBDP: 25.0000 mg | ORAL_TABLET | Freq: Every evening | ORAL | 3 refills | Status: DC | PRN

## 2023-10-19 NOTE — Telephone Encounter (Signed)
Patient's brother reports that recently he has been sleeping 2 hours nightly. He also notes that his appetite has increased and he is more active.  Provider informed patient that Unisom 25-50 mg can be added on the patient to be reassessed in a few days if it is not better in his sleep.  Provider also discussed increasing Ambien if the Unisom is ineffective.  He endorsed understanding and agreed. Provider asked patient If the patient has been taking Depakote as prescribed. He notes that recently he started taking it 3 times daily. No other concerns noted at this time.

## 2023-10-19 NOTE — Telephone Encounter (Signed)
Patient's brother called for advice. Patient is not sleeping, hungry all the time and aggressive with family. He wants to stay outside and walk and not come inside. He changes his mind quickly and frequently. Family is scared because he is not sleeping. Max of 2 hours nightly. Asking what can be done or what they should do.

## 2023-11-07 ENCOUNTER — Encounter (HOSPITAL_COMMUNITY): Payer: Self-pay | Admitting: Psychiatry

## 2023-11-07 ENCOUNTER — Telehealth (INDEPENDENT_AMBULATORY_CARE_PROVIDER_SITE_OTHER): Payer: MEDICAID | Admitting: Psychiatry

## 2023-11-07 DIAGNOSIS — F411 Generalized anxiety disorder: Secondary | ICD-10-CM | POA: Diagnosis not present

## 2023-11-07 DIAGNOSIS — F319 Bipolar disorder, unspecified: Secondary | ICD-10-CM

## 2023-11-07 MED ORDER — OLANZAPINE 10 MG PO TABS
10.0000 mg | ORAL_TABLET | Freq: Two times a day (BID) | ORAL | 3 refills | Status: DC
Start: 2023-11-07 — End: 2024-01-10

## 2023-11-07 MED ORDER — PROPRANOLOL HCL 10 MG PO TABS
10.0000 mg | ORAL_TABLET | Freq: Two times a day (BID) | ORAL | 3 refills | Status: DC
Start: 2023-11-07 — End: 2024-01-10

## 2023-11-07 MED ORDER — UNISOM SLEEPMELTS 25 MG PO TBDP: 25.0000 mg | ORAL_TABLET | Freq: Every evening | ORAL | 3 refills | Status: DC | PRN

## 2023-11-07 MED ORDER — DIVALPROEX SODIUM ER 500 MG PO TB24
500.0000 mg | ORAL_TABLET | Freq: Three times a day (TID) | ORAL | 3 refills | Status: DC
Start: 2023-11-07 — End: 2024-01-10

## 2023-11-07 MED ORDER — ZOLPIDEM TARTRATE 5 MG PO TABS
10.0000 mg | ORAL_TABLET | Freq: Every evening | ORAL | 1 refills | Status: DC | PRN
Start: 2023-11-07 — End: 2024-02-14

## 2023-11-07 NOTE — Progress Notes (Signed)
BH MD/PA/NP OP Progress Note Virtual Visit via Video Note  I connected with Phillip [name] on 11/07/23 at  2:00 PM EST by a video enabled telemedicine application and verified that I am speaking with the correct person using two identifiers.  Location: Patient: Home Provider: Clinic   I discussed the limitations of evaluation and management by telemedicine and the availability of in person appointments. The patient expressed understanding and agreed to proceed.  I provided 30 minutes of non-face-to-face time during this encounter.   11/07/2023 2:40 PM Phillip [name]  MRN:  [id number]  Chief Complaint: "I have been feel sleepy" Per mother "We have not seen any improvement"   HPI: 22 year old male seen today for follow up psychiatric evaluation.  He has a psychiatric history of Bipolar disorder and possible IDD. He is currently managed on  Zyprexa 10 mg twice daily, Ambien 10 mg, Unisom 25-50 mg nightly as needed, Depakote 500 mg three times daily, propranolol 10 mg three times daily, and trazodone 50-100 mg nightly.  He notes his medications are somewhat effective in managing his psychiatric conditions.   Today provider utilizes Arabic interpreter as this is patient's primary language.  During the exam patient he was well groomed, pleasant, cooperative and engaged in conversation. He informed Clinical research associate that he has been sleepy. He notes that he goes to sleep at 12 midnight and wakes up at 3:00 pm when not in school. On days that he is in school he sleeps 7 hours.  Patient notes that he want to feel more stable.  At patient's last visit he was only sleeping 2 to 3 hours nightly.  Patient was seen with his mother who notes that they have not noticed any improvement.  She reports that at times he is active and other times she is depressed.  She reports that when he is not as active states he lacks sleep and is more irritable.  Patient's mother notes that she and her family would like him to receive  care from a different provider.  Provider was agreeable to referring patient to another clinician with inside the clinic.  They are grateful for this.   Since his last visit he informed writer that he has not been anxious and depressed.  He denies symptoms of psychosis.  Patient major complaint is sedation but at this time wishes not to change/reduce his medications.  No medication changes made today.  Patient agreeable to continue medication as prescribed.  He will follow-up with Dr. Precious Haws in January for further assessment.  No other concerns noted at this.       Visit Diagnosis:    ICD-10-CM   1. Bipolar 1 disorder (HCC)  F31.9 zolpidem (AMBIEN) 5 MG tablet    propranolol (INDERAL) 10 MG tablet    OLANZapine (ZYPREXA) 10 MG tablet    divalproex (DEPAKOTE ER) 500 MG 24 hr tablet    diphenhydrAMINE HCl, Sleep, (UNISOM SLEEPMELTS) 25 MG TBDP    2. Generalized anxiety disorder  F41.1 propranolol (INDERAL) 10 MG tablet          Past Psychiatric History: Bipolar disorder and possible IDD   Past Medical History:  Past Medical History:  Diagnosis Date   Bipolar 1 disorder (HCC)    No past surgical history on file.  Family Psychiatric History:  Brother depression   Family History: No family history on file.  Social History:  Social History   Socioeconomic History   Marital status: Single    Spouse name: Not on  file   Number of children: Not on file   Years of education: Not on file   Highest education level: Not on file  Occupational History   Not on file  Tobacco Use   Smoking status: Never   Smokeless tobacco: Never  Substance and Sexual Activity   Alcohol use: Not Currently   Drug use: Not Currently   Sexual activity: Not on file  Other Topics Concern   Not on file  Social History Narrative   Not on file   Social Determinants of Health   Financial Resource Strain: Not on file  Food Insecurity: No Food Insecurity (11/29/2022)   [facility] prior to 02/26/2023., [facility] visits prior to 02/26/2023., [facility]   Hunger Vital Sign    Worried About Running Out of Food in the Last Year: Never true    Ran Out of Food in the Last Year: Never true  Transportation Needs: No Transportation Needs (11/29/2022)   Received from [employer], [facility] visits prior to 02/26/2023., [facility] visits prior to 02/26/2023., [facility]   PRAPARE - Transportation    Lack of Transportation (Medical): No    Lack of Transportation (Non-Medical): No  Physical Activity: Not on file  Stress: Not on file  Social Connections: Unknown (03/28/2023)   Received from [facility], [facility]   Social Network    Social Network: Not on file    Allergies: No Known Allergies  Metabolic Disorder Labs: No results found for: "HGBA1C", "MPG" No results found for: "PROLACTIN" No results found for: "CHOL", "TRIG", "HDL", "CHOLHDL", "VLDL", "LDLCALC" No results found for: "TSH"  Therapeutic Level Labs: No results found for: "LITHIUM" Lab Results  Component Value Date   VALPROATE 15 (L) 04/01/2023   VALPROATE 81 11/28/2021   No results found for: "CBMZ"  Current Medications: Current Outpatient Medications  Medication Sig Dispense Refill   Amantadine HCl 100 MG tablet Take 1 tablet (100 mg total) by mouth daily. 30 tablet 3   cetirizine (ZYRTEC) 10 MG tablet Take 10 mg by mouth daily.     clonazePAM (KLONOPIN) 0.5 MG tablet Take 1 tablet (0.5 mg total) by mouth 2 (two) times daily as needed (agitation).     diphenhydrAMINE HCl, Sleep, (UNISOM SLEEPMELTS) 25 MG TBDP Take 1-2 tablets (25-50 mg total) by mouth at bedtime as needed. 60 tablet 3   divalproex (DEPAKOTE ER) 500 MG 24 hr tablet Take 1 tablet (500 mg total) by mouth 3 (three) times daily. 90 tablet 3   OLANZapine (ZYPREXA) 10 MG tablet Take 1 tablet (10 mg total) by mouth 2 (two) times  daily. 60 tablet 3   pantoprazole (PROTONIX) 40 MG tablet Take 40 mg by mouth daily.     propranolol (INDERAL) 10 MG tablet Take 1 tablet (10 mg total) by mouth 2 (two) times daily. 60 tablet 3   zolpidem (AMBIEN) 5 MG tablet Take 2 tablets (10 mg total) by mouth at bedtime as needed for sleep. 60 tablet 1   No current facility-administered medications for this visit.     Musculoskeletal: Strength & Muscle Tone: decreased and Telehealth visit Gait & Station: unsteady, Telehealth visit Patient leans: N/A  Psychiatric Specialty Exam: Review of Systems  There were no vitals taken for this visit.There is no height or weight on file to calculate BMI.  General Appearance: Well Groomed  Eye Contact:  Good  Speech:  Normal Rate  Volume:  Normal  Mood:  Depressed  Affect:  Appropriate and Congruent  Thought Process:  Coherent, Goal Directed, and Linear  Orientation:  Full (Time, Place, and Person)  Thought Content: WDL and Logical   Suicidal Thoughts:  No  Homicidal Thoughts:  No  Memory:  Immediate;   Good Recent;   Good Remote;   Good  Judgement:  Good  Insight:  Good  Psychomotor Activity:  Decreased  Concentration:  Concentration: Good and Attention Span: Good  Recall:  Good  Fund of Knowledge: Good  Language: Good  Akathisia:  No  Handed:  Right  AIMS (if indicated): not done  Assets:  Communication Skills Desire for Improvement Financial Resources/Insurance Housing Leisure Time Physical Health Social Support  ADL's:  Intact  Cognition: WNL  Sleep:  Fair   Screenings: GAD-7    Flowsheet Row Office Visit from 04/04/2023 in [facility]  Total GAD-7 Score 11      PHQ2-9    Flowsheet Row Office Visit from 04/04/2023 in [place]  PHQ-2 Total Score 1  PHQ-9 Total Score 13      Flowsheet Row ED from 05/19/2023 in [facility] Most recent reading at 05/19/2023 12:52 PM ED from  03/31/2023 in [facility] Emergency Department at [facility] Most recent reading at 03/31/2023  5:09 PM ED from 03/31/2023 in [facility] Most recent reading at 03/31/2023  2:57 PM  C-SSRS RISK CATEGORY No Risk No Risk No Risk        Assessment and Plan: Today patient reports he sleeps 7 to 12 hours.  At patient's last visit he was sleeping 2 to 3 hours.  Patient's mother notes that she has not seen improvement as patient continues to be active (irritable with no sleep) or non active with less irritability and increased sleep.  Patient's mother requested that he be seen by another clinician.  Provider was agreeable to referring patient to another provider within the clinic. Patient major complaint is sedation but at this time wishes not to change/reduce his medications.  No medication changes made today.  Patient agreeable to continue medication as prescribed.  He will follow-up with Dr. Precious Haws in January for further assessment.  1. Bipolar 1 disorder (HCC)  Continue- zolpidem (AMBIEN) 5 MG tablet; Take 2 tablets (10 mg total) by mouth at bedtime as needed for sleep.  Dispense: 60 tablet; Refill: 1 Continue- propranolol (INDERAL) 10 MG tablet; Take 1 tablet (10 mg total) by mouth 2 (two) times daily.  Dispense: 60 tablet; Refill: 3 Continue- OLANZapine (ZYPREXA) 10 MG tablet; Take 1 tablet (10 mg total) by mouth 2 (two) times daily.  Dispense: 60 tablet; Refill: 3 Continue- divalproex (DEPAKOTE ER) 500 MG 24 hr tablet; Take 1 tablet (500 mg total) by mouth 3 (three) times daily.  Dispense: 90 tablet; Refill: 3 Continue- diphenhydrAMINE HCl, Sleep, (UNISOM SLEEPMELTS) 25 MG TBDP; Take 1-2 tablets (25-50 mg total) by mouth at bedtime as needed.  Dispense: 60 tablet; Refill: 3  2. Generalized anxiety disorder  Continue- propranolol (INDERAL) 10 MG tablet; Take 1 tablet (10 mg total) by mouth 2 (two) times daily.  Dispense: 60 tablet; Refill: 3  Collaboration of  Care: Collaboration of Care: Other provider involved in patient's care AEB PCP  Patient/Guardian was advised Release of Information must be obtained prior to any record release in order to collaborate their care with an outside  provider. Patient/Guardian was advised if they have not already done so to contact the registration department to sign all necessary forms in order for [place] to release information regarding their care.   Consent: Patient/Guardian gives verbal consent for treatment and assignment of benefits for services provided during this visit. Patient/Guardian expressed understanding and agreed to proceed.   Follow-up in 2.5 months Shanna Cisco, NP 11/07/2023, 2:40 PM

## 2024-01-09 NOTE — Progress Notes (Signed)
 BH MD Outpatient Progress Note  01/10/2024 3:38 PM Phillip [name]  MRN:  [id number]  Assessment:  Phillip [name] presents for follow-up evaluation. Today, 01/10/24, patient is seen with his brother who provides majority of history.  Patient carries historical diagnosis of bipolar 1 disorder and patient's brother describes discrete mood episodes fluctuating between active states in which patient experiences marked mood lability, decreased need for sleep, impulsivity, psychomotor agitation, aggression towards self and others, and AH (although patient describes this as negative internal dialogue) and inactive states in which he sleeps excessively and appears oversedated.  Patient's brother reports that he has been in inactive state for the past month. Patient denies current symptoms concerning for depression, anxiety, or irritability today and notes his main issue is daytime fatigue.  He has not had any episodes of aggression over the past month and denies symptoms of psychosis.  It is likely that current medication regimen is contributing to fatigue and will redistribute dosing to facilitate sleep at night and minimize daytime sedation.  Discussed importance of consistent sleep schedule and proper sleep hygiene with both patient and brother although this will need to be further explored in subsequent visits.  Will obtain updated labs as below to guide further medication management and discussed importance of obtaining repeat EKG given abnormality noted in April 2024 that did not receive subsequent follow-up; this writer reached out to patient's primary care office to facilitate obtaining EKG.  Return to care in 5 weeks by video.  Identifying Information: Phillip [name] is a 23 y.o. 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    Plan:  # Historical diagnosis of bipolar 1 disorder Past medication trials: Invega  PO, Seroquel , Abilify  PO and Maintena, amantadine , trazodone  Status of problem: New problem to this provider Interventions: -- REDISTRIBUTE dosing of Depakote  to 500 mg qAM + 1000 mg qHS -- REDISTRIBUTE dosing of Zyprexa  to 20 mg nightly -- Continue propranolol  10 mg twice daily -- Patient not currently using Ambien  10 mg nightly as needed -- MRI brain 09/09/22 with no acute intracranial abnormality; demonstrated periventricular leukomalacia -- R/o contributing medical conditions: Vitamin D  ordered given history of vitamin D  deficiency  # Delayed sleep phase Past medication trials: melatonin (ineffective), trazodone , Ambien  Status of problem: New problem to this provider Interventions: -- Encouraged consistent sleep schedule -- Counseled against use of electronics prior to bedtime; will need to further explore sleep hygiene at subsequent visits -- Recommend against continued use of Ambien ; not currently using -- Can reconsider melatonin for circadian rhythm regulation; brother has declined given inefficacy in the past  # Cerebral palsy Status of problem: chronic Interventions: -- Followed by [facility] neurolog[facility] with baseline left-sided weakness, nystagmus -- Awaiting admission to special school that sounds like day programming  # Medication monitoring Interventions: -- Zyprexa   -- Lipid profile: Ordered  -- HgbA1c: Ordered  -- 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   Subjective:  Chief Complaint:  Chief Complaint  Patient presents with   Medication Management    Interval History:   Chart review: -- Last seen by Laymon Bach NP 11/07/23; diagnoses felt c/w bipolar 1 disorder, GAD. Managed on Zyprexa  10 mg BID, Ambien  10 mg, Unisom  25-50 mg nightly PRN, Depakote  500 mg TID, propranolol  10 mg BID, trazodone  50-100 mg nightly.  -- Neurology note 08/18/22: Per brother and mother, patient has had chronic issues with strength, tremors and developmental delays in regards to his speech and language. They report that he had delays in all developmental milestones and they worked diligently with physical, occupational and speech therapy to improve his function and quality of life.   Patient seen with brother, Phillip [name].  Phillip [name] requests to speak without use of interpreter to provide collateral and patient was in agreement.   Phillip [name] reports patient has issues between between being too active vs. inactive. Fluctuates between weeks at a time in which he doesn't sleep and others in which he sleeps 15 hours. Medications have not been helpful thus far. Over the last month, has been mostly inactive. Sleeping between 12-15 hours (both during nighttime and daytime hours) and reports constant fatigue. Falling asleep around 2AM/5AM and waking up at 3-4PM.   When overactive, he is out of control and more aggressive; sleeping 1 hour during the day. May hit and push others; labile affect (crying and laughing at the same time); attempting to elope; constantly eating every hour. History of self-harm in form of punching walls and head against wall.   Experienced hypoxia at delivery and has diagnosis of cerebral palsy. When doing well, his baseline is almost like a normal guy - sleeps 8-10 hours, watches TV, can clearly communicate needs, takes care of self. Will obtain already prepared food; can use stove and microwave. Dresses himself but needs help matching clothes.  Bathes and brushes teeth but needs reminders. He doesn't manage money; would make counting mistakes in [place]. Family manages medications as he would not take them on his own.  Moved from [place] in 2022. Finished high school in [place]; was in special needs school. Couldn't go to college because didn't have specialist school. He had friends and did well in school.   Has reported hearing voices - one voice is positive and another is negative. Has reported VH.   Lives with brother, sister, and parents. Parents are divorcing and this may be worsening behaviors - they try to keep details of this away from patient but know that he is somewhat aware of what is going on.  He hasn't been complaining of any physical issues.  They have not had to use Ambien  and have not found it helpful when it was needed.  Immigrated Nov 2022. Was on Depakote  in [place] and one other medication. Wasn't having these mood fluctuations in [place] and doing really well.   Arabic interpreter utilized for remainder of visit.  Asahd reports he is feeling good. States his main issue is feeling tired throughout the day and not able to do things he would like to be doing like working out. Describes his day: eats breakfast, takes a shower, watches TV. Reports sleep has been shifted - tries to go to bed around 11AM; falls asleep around 3AM. When he does fall asleep, sleeps well and stays asleep.  Wakes up in the afternoon. Watches TV at night. When he can't sleep, goes on his phone. Desired bed time is 12AM (this is in line  with family's sleep schedule).   Feels that medications may make him more sleepy. When he goes to English language class, feels sedated. Brother hasn't noticed this. Appetite is stable.   Feels safe in the home. Sometimes can feel down but denies feeling down or sad persistently. Denies feeling significantly anxious or worried. Denies feeling frustrated or irritated. Denies SI/HI, AVH, paranoia currently. Reports he used  to experience AH, last experienced this about a year ago. At that time, was hearing you're not good enough or you're not doing well and describes as occurring inside his head. Denies external AH. Denies VH. Denies paranoia.   His goals are to feel less sedated during the day; wants to be more independent and not have to rely on family as much.   Reports adherence with medications; denies cheeking. He does not necessarily feel medications have been helpful.    Diagnostic conceptualization discussed with both patient and brother.  Discussed recommendation to optimize current regimen with redistribution of dosing to facilitate sleep at night and minimize daytime fatigue.  Reviewed importance of consistent sleep schedule and appropriate sleep hygiene.  Discussed need for repeat labs as well as EKG to guide further medication changes.  All questions/concerns addressed.    PDMP: -- Zolpidem  5 mg tablet QTY 60 last filled 11/07/23; fills dating back to May 2024  Visit Diagnosis:    ICD-10-CM   1. High risk medication use  Z79.899 HgB A1c    Lipid Profile    Valproic Acid  level    2. Bipolar 1 disorder (HCC)  F31.9 divalproex  (DEPAKOTE  ER) 500 MG 24 hr tablet    OLANZapine  (ZYPREXA ) 10 MG tablet    propranolol  (INDERAL ) 10 MG tablet    HgB A1c    Lipid Profile    Valproic Acid  level    Vitamin D  (25 hydroxy)    3. Generalized anxiety disorder  F41.1 propranolol  (INDERAL ) 10 MG tablet      Past Psychiatric History:  Diagnoses: Historical diagnosis of bipolar 1 disorder; developmental delay secondary to cerebral palsy Medication trials: Invega  PO, Seroquel , Abilify  PO and Maintena, amantadine , trazodone  Hospitalizations: denies Suicide attempts: denies SIB: May bang head against wall during periods of agitation Hx of violence towards others: Has experienced aggression towards family and punching of walls during periods of agitation Substance use: denies use of etoh, illicit drugs,  cannabis/CBD/THC, tobacco use Developmental: Full-term; hypoxia at delivery resulting in developmental delays  Past Medical History:  Past Medical History:  Diagnosis Date   Bipolar 1 disorder (HCC)    Cerebral palsy (HCC)    History reviewed. No pertinent surgical history.  Family Psychiatric History:  Brother: depression  Family History: History reviewed. No pertinent family history.  Social History:  Academic/Vocational: Attended special needs high school in [place]  Social History   Socioeconomic History   Marital status: Single    Spouse name: Not on file   Number of children: Not on file   Years of education: Not on file   Highest education level: Not on file  Occupational History   Not on file  Tobacco Use   Smoking status: Never   Smokeless tobacco: Never  Substance and Sexual Activity   Alcohol use: Never   Drug use: Never   Sexual activity: Not on file  Other Topics Concern   Not on file  Social History Narrative   Not on file   Social Drivers of Health   Financial Resource Strain: Not on file  Food Insecurity: No  Food Insecurity (11/29/2022)   [facility], [facility] visits prior to 02/26/2023., [facility] visits prior to 02/26/2023[facility]   Hunger Vital Sign    Worried About Running Out of Food in the Last Year: Never true    Ran Out of Food in the Last Year: Never true  Transportation Needs: No Transportation Needs (11/29/2022)   [facility], [facility] visits prior to 02/26/2023., [facility] visits prior to 02/26/2023[facility]   PRAPARE - Transportation    Lack of Transportation (Medical): No    Lack of Transportation (Non-Medical): No  Physical Activity: Not on file  Stress: Not on file  Social Connections: Unknown (03/28/2023)   [facility] from [facility], [facility]   Social Network    Social Network: Not on file     Allergies: No Known Allergies  Current Medications: Current Outpatient Medications  Medication Sig Dispense Refill   zolpidem  (AMBIEN ) 5 MG tablet Take 2 tablets (10 mg total) by mouth at bedtime as needed for sleep. 60 tablet 1   divalproex  (DEPAKOTE  ER) 500 MG 24 hr tablet Take 1 tablet (500 mg total) by mouth in the morning AND 2 tablets (1,000 mg total) at bedtime. 90 tablet 1   OLANZapine  (ZYPREXA ) 10 MG tablet Take 2 tablets (20 mg total) by mouth at bedtime. 60 tablet 1   propranolol  (INDERAL ) 10 MG tablet Take 1 tablet (10 mg total) by mouth 2 (two) times daily. 60 tablet 1   No current facility-administered medications for this visit.    ROS: Patient denies any physical complaints today  Objective:  Psychiatric Specialty Exam: Blood pressure 132/76, pulse 75, height 5' 4 (1.626 m), weight 159 lb (72.1 kg), SpO2 100%.Body mass index is 27.29 kg/m.  General Appearance: Casual and Well Groomed  Eye Contact:  Minimal; noted to have horizontal nystagmus  Speech:  Normal Rate and requires Arabic interpreter  Volume:  Normal  Mood:   Good, calm  Affect:   Euthymic, calm  Thought Content:  Denies AVH; no overt delusional thought content on interview     Suicidal Thoughts:  No  Homicidal Thoughts:  No  Thought Process: Brief however overall linear/logical  Orientation:  Full (Time, Place, and Person)    Memory: Grossly intact  Judgment:  Fair  Insight:  Fair  Concentration:  Concentration: Good  Recall: not formally assessed   Fund of Knowledge: Fair  Language: Good  Psychomotor Activity:  Normal  Akathisia:  No  AIMS (if indicated): not done  Assets:  Communication Skills Desire for Improvement Housing Leisure Time Social Support Transportation  ADL's:  Intact; requires assistance with iADLs  Cognition: Concern for mild impairment  Sleep:   Delayed sleep phase   PE: General: well-appearing; no acute distress; well groomed Pulm: no increased work of  breathing on room air  Strength & Muscle Tone:  Not formally tested; spontaneous movement of all extremities Neuro: Horizontal nystagmus Gait & Station: normal  Metabolic Disorder Labs: No results found for: HGBA1C, MPG No results found for: PROLACTIN No results found for: CHOL, TRIG, HDL, CHOLHDL, VLDL, LDLCALC No results found for: TSH  Therapeutic Level Labs: No results found for: LITHIUM Lab Results  Component Value Date   VALPROATE 15 (L) 04/01/2023   VALPROATE 81 11/28/2021   No results found for: CBMZ  Screenings:  GAD-7    Flowsheet Row Office Visit from 04/04/2023 in  [facility]  Total GAD-7 Score 11      PHQ2-9    Flowsheet Row Office Visit from 04/04/2023 in [facility]  PHQ-2 Total Score 1  PHQ-9 Total Score 13      Flowsheet Row ED from 05/19/2023 in [facility] Most recent reading at 05/19/2023 12:52 PM ED from 03/31/2023 in [facility] at [facility] Most recent reading at 03/31/2023  5:09 PM ED from 03/31/2023 in [facility] Most recent reading at 03/31/2023  2:57 PM  C-SSRS RISK CATEGORY No Risk No Risk No Risk       Collaboration of Care: Collaboration of Care: Medication Management AEB active medication management and Psychiatrist AEB established with this provider  Patient/Guardian was advised Release of Information must be obtained prior to any record release in order to collaborate their care with an outside provider. Patient/Guardian was advised if they have not already done so to contact the registration department to sign all necessary forms in order for us  to release information regarding their care.   Consent: Patient/Guardian gives verbal consent for treatment and assignment of benefits for services provided during this visit. Patient/Guardian expressed understanding and agreed to  proceed.   A total of 120 minutes was spent involved in face to face clinical care, chart review, documentation, collateral from brother, use of interpreter services, and coordination with primary care provider.   Winnell Bento A Brandice Busser 01/10/2024, 3:38 PM

## 2024-01-10 ENCOUNTER — Encounter (HOSPITAL_COMMUNITY): Payer: Self-pay | Admitting: Psychiatry

## 2024-01-10 ENCOUNTER — Ambulatory Visit (INDEPENDENT_AMBULATORY_CARE_PROVIDER_SITE_OTHER): Payer: Medicaid Other | Admitting: Psychiatry

## 2024-01-10 VITALS — BP 132/76 | HR 75 | Ht 64.0 in | Wt 159.0 lb

## 2024-01-10 DIAGNOSIS — F319 Bipolar disorder, unspecified: Secondary | ICD-10-CM | POA: Diagnosis not present

## 2024-01-10 DIAGNOSIS — Z79899 Other long term (current) drug therapy: Secondary | ICD-10-CM

## 2024-01-10 DIAGNOSIS — F411 Generalized anxiety disorder: Secondary | ICD-10-CM

## 2024-01-10 MED ORDER — OLANZAPINE 10 MG PO TABS
20.0000 mg | ORAL_TABLET | Freq: Every day | ORAL | 1 refills | Status: DC
Start: 2024-01-10 — End: 2024-01-13

## 2024-01-10 MED ORDER — PROPRANOLOL HCL 10 MG PO TABS: 10.0000 mg | ORAL_TABLET | Freq: Two times a day (BID) | ORAL | 1 refills | Status: DC

## 2024-01-10 MED ORDER — DIVALPROEX SODIUM ER 500 MG PO TB24
ORAL_TABLET | ORAL | 1 refills | Status: DC
Start: 2024-01-10 — End: 2024-02-14

## 2024-01-10 NOTE — Patient Instructions (Signed)
 Thank you for attending your appointment today.  -- CHANGE how you take Depakote  to 500 mg in the morning and 1000 mg at night -- CHANGE how you take olanzapine  to 20 mg nightly -- Continue other medications as prescribed. -- Please get an EKG through your primary care provider.  Please do not make any changes to medications without first discussing with your provider. If you are experiencing a psychiatric emergency, please call 911 or present to your nearest emergency department. Additional crisis, medication management, and therapy resources are included below.  [facility]  [address] [place], [place] [address] [phone] WALK-IN URGENT CARE 24/7 FOR ANYONE [address], [place], [place]  [phone] Fax: [phone] [email or web address] *Interpreters available *Accepts all insurance and uninsured for Urgent Care needs *Accepts Medicaid and uninsured for outpatient treatment (below)      ONLY FOR [facility]  Below:    Outpatient New Patient Assessment/Therapy Walk-ins:        Monday, Wednesday, and Thursday 8am until slots are full (first come, first served)                   New Patient Psychiatry/Medication Management        Monday-Friday 8am-11am (first come, first served)               For all walk-ins we ask that you arrive by 7:15am, because patients will be seen in the order of arrival.

## 2024-01-12 ENCOUNTER — Telehealth (HOSPITAL_COMMUNITY): Payer: Self-pay

## 2024-01-12 NOTE — Telephone Encounter (Signed)
Medication management - Call from Dr.Palakshappa's nurse with request to let Dr. Josephina Shih know Dr. Lorenda Cahill had placed the order for patient to have an EKG for patient and wanted to let Dr. Josephina Shih know.

## 2024-01-13 ENCOUNTER — Telehealth (HOSPITAL_COMMUNITY): Payer: Self-pay

## 2024-01-13 DIAGNOSIS — F319 Bipolar disorder, unspecified: Secondary | ICD-10-CM

## 2024-01-13 MED ORDER — OLANZAPINE 20 MG PO TABS: 20.0000 mg | ORAL_TABLET | Freq: Every day | ORAL | 1 refills | Status: DC

## 2024-01-13 NOTE — Telephone Encounter (Signed)
Medication problem - Fax from patient's [facility] for a prior authorization for newly prescribed Olanzapine, 2 at bedtime. Insurance prefers Olanzapine 20 mg, 1 at bedtime.

## 2024-01-18 ENCOUNTER — Other Ambulatory Visit (HOSPITAL_COMMUNITY): Payer: MEDICAID

## 2024-01-19 NOTE — Telephone Encounter (Signed)
Medication managment - Telephone call with patient's brother who stated understanding medication directions and for patient to only be taking Olanzapine 20 mg, 1 a day, and if used 10 mg tablets no more than 20 mg total for a day. Stated they had been doing this and appreciated the call.

## 2024-01-23 ENCOUNTER — Other Ambulatory Visit (INDEPENDENT_AMBULATORY_CARE_PROVIDER_SITE_OTHER): Payer: MEDICAID

## 2024-01-23 DIAGNOSIS — F319 Bipolar disorder, unspecified: Secondary | ICD-10-CM

## 2024-01-23 DIAGNOSIS — Z79899 Other long term (current) drug therapy: Secondary | ICD-10-CM

## 2024-01-23 NOTE — Progress Notes (Cosign Needed)
Pt presents today for Blood draw, which was performed successfully with no complaints. Total of 4 tubes collected.    Alphonzo Cruise, CMA

## 2024-01-24 LAB — LIPID PANEL
Chol/HDL Ratio: 3.7 {ratio} (ref 0.0–5.0)
Cholesterol, Total: 179 mg/dL (ref 100–199)
HDL: 48 mg/dL (ref >39)
LDL Chol Calc (NIH): 111 mg/dL — ABNORMAL HIGH (ref 0–99)
Triglycerides: 109 mg/dL (ref 0–149)
VLDL Cholesterol Cal: 20 mg/dL (ref 5–40)

## 2024-01-24 LAB — VITAMIN D 25 HYDROXY (VIT D DEFICIENCY, FRACTURES): Vit D, 25-Hydroxy: 16.9 ng/mL — ABNORMAL LOW (ref 30.0–100.0)

## 2024-01-24 LAB — HEMOGLOBIN A1C
Est. average glucose Bld gHb Est-mCnc: 91 mg/dL
Hgb A1c MFr Bld: 4.8 % (ref 4.8–5.6)

## 2024-01-24 LAB — VALPROIC ACID LEVEL: Valproic Acid Lvl: 50 ug/mL (ref 50–100)

## 2024-02-13 NOTE — Progress Notes (Unsigned)
BH MD Outpatient Progress Note  02/14/2024 10:49 AM Phillip [name]  MRN:  [id number]  Assessment:  Phillip [name] presents for follow-up evaluation. Today, 02/14/24, patient's brother reports patient tolerated redistribution of medications well although continues to have significant daytime sedation. Patient is fairly inactive during the day and often reports feeling "bored" - patient denies signs/sx of depression at this time and identifies looking forward to starting day programming tomorrow. Will not make any changes to medication regimen in light of starting at day program however encouraged brother to reach out if daytime sedation appears to interfere with participation or attendance at day program and reduction to olanzapine can be considered before next visit. Ultimately, feel that increased activity and stimulation at day program will provide necessary behavioral activation and help return to normal sleep schedule. Counseled against prolonged naps during the daytime. Reminded patient's brother to obtain EKG before next appointment.   RTC in 7 weeks by video.  Identifying Information: Phillip [name] is a 22 y.o. 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 Patient carries historical diagnosis of bipolar 1 disorder - per report from family patient experiences discrete mood episodes fluctuating between "active" states in which patient experiences marked mood lability, decreased need for sleep, impulsivity, psychomotor agitation, aggression towards self and others, and AH (although patient describes this as negative internal dialogue) and "inactive" states in which he sleeps excessively and appears oversedated. Most recently, he has been in an "inactive" state. Patient denies current symptoms concerning for depression, anxiety, or  irritability and primarily notes significant daytime fatigue. He has not had any episodes of aggression over the past month and denies symptoms of psychosis. It is likely that current medication regimen is contributing to fatigue and will work to consolidate medications (in particular reduce Zyprexa) as tolerated to minimize daytime sedation. Additionally, discussed importance of consistent sleep schedule and proper sleep hygiene with both patient and brother as he demonstrates delayed sleep phase.   Plan:  # Historical diagnosis of bipolar 1 disorder Past medication trials: Invega PO, Seroquel, Abilify PO and Maintena, amantadine, trazodone, Ambien Status of problem: New problem to this provider Interventions: -- Continue Depakote ER 500 mg qAM + 1000 mg at bedtime  -- If patient's behaviors recur, will pursue further titration of Depakote as tolerated -- Continue Zyprexa to 20 mg nightly  -- Plan for gradual taper starting next visit if daytime sedation persists -- Continue propranolol 10 mg twice daily  # Delayed sleep phase Past medication trials: melatonin (ineffective), trazodone, Ambien Status of problem: New problem to this provider Interventions: -- Encouraged consistent sleep schedule -- Counseled against use of electronics prior to bedtime; will need to further explore sleep hygiene at subsequent visits -- Can reconsider melatonin for circadian rhythm regulation; brother has declined given inefficacy in the past  # Cerebral palsy with developmental delay Status of problem: chronic Interventions: -- Followed by [facility] with baseline left-sided weakness, nystagmus -- Will be starting day programming M-F tomorrow 02/15/24 -- MRI brain 09/09/22 with no acute intracranial abnormality; demonstrated periventricular leukomalacia  # Vitamin D deficiency Status of problem: acute on chronic Interventions: -- Vit D 01/23/24 16.9: START Vitamin D3 2000 IU daily  # Medication  monitoring Interventions: -- Zyprexa  -- Lipid profile revealing for elevated LDL (01/23/24)  -- HgbA1c wnl (01/23/24)  -- EKG 04/01/23: NSR however report indicates  WPW which is not mentioned in chart; Qtc 478   -- This writer previously communicated with PCP's office to get EKG which has been ordered; reminded patient's brother to get EKG done before next appointment -- Depakote:  -- CMP wnl 03/31/23  -- CBC wnl 03/31/23  -- Depakote level 50 01/23/24 (true trough is likely lower than this level given time of draw and ER formulation)    Patient was given contact information for behavioral health clinic and was instructed to call 911 for emergencies.   Subjective:  Chief Complaint:  Chief Complaint  Patient presents with   Medication Management    Interval History:   Spoke with patient's brother, Phillip [name], with consent of patient.   Patient's brother reports he is about the "same" in terms of remaining in "inactive mood." Denies any agitation. Tolerated change in timing of medications however continues to sleep a lot during the day; family has been trying to keep him active when they can. Typically sleeping at 12AM/1PM; will wake up early in the afternoon. He will be starting school tomorrow with hours of 8:30AM-2:30PM M-F. He shares that Phillip [name] has expressed feeling excited about going to school and getting out of the home. Continues to nap in the evenings (4/5-7PM); discussed limiting daytime naps and setting alarms.  As he will be starting day programming, discussed no changes to medications today but will consider reduction in Zyprexa at next visit if daytime sedation persists. Reviewed low vitamin D and discussed starting Vitamin D 2000 IU daily. Reviewed need to obtain updated EKG. Questions answered about future directions with medications and likely long term indication for a mood stabilizer although with goal of consolidation of medications as tolerated.  Spoke with Phillip [name] by video. Arabic  virtual interpreter utilized for visit.   Phillip [name] reports he is "not bad" and is looking forward to starting school tomorrow. He will be going to the gym and be more active. Falling asleep around 12AM and waking up in the afternoon unless he has appointments. Describes mood as "good" and denies feeling low or depressed. Denies SI/HI. Denies AVH. Reviewed plan with patient; no questions or concerns.    PDMP: -- Zolpidem 5 mg tablet QTY 60 last filled 11/07/23; fills dating back to May 2024  Visit Diagnosis:    ICD-10-CM   1. Bipolar 1 disorder (HCC)  F31.9 divalproex (DEPAKOTE ER) 500 MG 24 hr tablet    OLANZapine (ZYPREXA) 20 MG tablet    propranolol (INDERAL) 10 MG tablet    2. Generalized anxiety disorder  F41.1 propranolol (INDERAL) 10 MG tablet    3. Cerebral palsy, unspecified type (HCC)  G80.9     4. Vitamin D deficiency  E55.9      Past Psychiatric History:  Diagnoses: Historical diagnosis of bipolar 1 disorder; developmental delay secondary to cerebral palsy Medication trials: Invega PO, Seroquel, Abilify PO and Maintena, amantadine, trazodone Hospitalizations: denies Suicide attempts: denies SIB: May bang head against wall during periods of agitation Hx of violence towards others: Has experienced aggression towards family and punching of walls during periods of agitation Substance use: denies use of etoh, illicit drugs, cannabis/CBD/THC, tobacco use Developmental: Full-term; hypoxia at delivery resulting in developmental delays  Past Medical History:  Past Medical History:  Diagnosis Date   Bipolar 1 disorder (HCC)    Cerebral palsy (HCC)    History reviewed. No pertinent surgical history.  Family Psychiatric History:  Brother: depression  Family History: History reviewed. No pertinent family history.  Social History:  Academic/Vocational: Attended special needs high school in [place]  Social History   Socioeconomic History   Marital status: Single    Spouse  name: Not on file   Number of children: Not on file   Years of education: Not on file   Highest education level: Not on file  Occupational History   Not on file  Tobacco Use   Smoking status: Never   Smokeless tobacco: Never  Substance and Sexual Activity   Alcohol use: Never   Drug use: Never   Sexual activity: Not on file  Other Topics Concern   Not on file  Social History Narrative   Not on file   Social Drivers of Health   Financial Resource Strain: Not on file  Food Insecurity: No Food Insecurity (11/29/2022)   [facility] to [facility]2023., [facility] visits prior to 02/26/2023., [facility]   Hunger Vital Sign    Worried About Running Out of Food in the Last Year: Never true    Ran Out of Food in the Last Year: Never true  Transportation Needs: No Transportation Needs (11/29/2022)   [facility] from [facility] [employer], [facility] visits prior to 02/26/2023., [facility] visits prior to 02/26/2023., [facility]   PRAPARE - Transportation    Lack of Transportation (Medical): No    Lack of Transportation (Non-Medical): No  Physical Activity: Not on file  Stress: Not on file  Social Connections: Unknown (03/28/2023)   Received from [facility], [facility]   Social Network    Social Network: Not on file    Allergies: No Known Allergies  Current Medications: Current Outpatient Medications  Medication Sig Dispense Refill   divalproex (DEPAKOTE ER) 500 MG 24 hr tablet Take 1 tablet (500 mg total) by mouth in the morning AND 2 tablets (1,000 mg total) at bedtime. 90 tablet 2   OLANZapine (ZYPREXA) 20 MG tablet Take 1 tablet (20 mg total) by mouth at bedtime. 30 tablet 2   propranolol (INDERAL) 10 MG tablet Take 1 tablet (10 mg total) by mouth 2 (two) times daily. 60 tablet 2   No current facility-administered medications for this visit.     ROS: Patient denies any physical complaints today  Objective:  Psychiatric Specialty Exam: There were no vitals taken for this visit.There is no height or weight on file to calculate BMI.  General Appearance: Casual and Well Groomed  Eye Contact:  Minimal; noted to have horizontal nystagmus  Speech:  Normal Rate and requires Arabic interpreter  Volume:  Normal  Mood:   "fine"  Affect:   Euthymic, calm  Thought Content:  Denies AVH; no overt delusional thought content on interview     Suicidal Thoughts:  No  Homicidal Thoughts:  No  Thought Process: Brief however overall linear/logical  Orientation:  Full (Time, Place, and Person)    Memory: Grossly intact  Judgment:  Fair  Insight:  Fair  Concentration:  Concentration: Good  Recall: not formally assessed   Fund of Knowledge: Fair  Language: Good  Psychomotor Activity:  Normal  Akathisia:  No  AIMS (if indicated): not done  Assets:  Communication Skills Desire for Improvement Housing Leisure Time Social Support Transportation  ADL's:  Intact; requires assistance with iADLs  Cognition: Concern for mild impairment  Sleep:   Delayed sleep phase   PE: General: sits comfortably in view of camera; no acute distress  Pulm:  no increased work of breathing on room air  MSK: all extremity movements appear intact  Neuro: Horizontal nystagmus Gait & Station: unable to assess by video   Metabolic Disorder Labs: Lab Results  Component Value Date   HGBA1C 4.8 01/23/2024   No results found for: "PROLACTIN" Lab Results  Component Value Date   CHOL 179 01/23/2024   TRIG 109 01/23/2024   HDL 48 01/23/2024   CHOLHDL 3.7 01/23/2024   LDLCALC 111 (H) 01/23/2024   No results found for: "TSH"  Therapeutic Level Labs: No results found for: "LITHIUM" Lab Results  Component Value Date   VALPROATE 50 01/23/2024   VALPROATE 15 (L) 04/01/2023   No results found for: "CBMZ"  Screenings:  GAD-7    Flowsheet Row Office  Visit from 04/04/2023 in [facility]  Total GAD-7 Score 11      PHQ2-9    Flowsheet Row Office Visit from 04/04/2023 in [facility]  PHQ-2 Total Score 1  PHQ-9 Total Score 13      Flowsheet Row ED from 05/19/2023 in [facility] Most recent reading at 05/19/2023 12:52 PM ED from 03/31/2023 in [facility] Most recent reading at 03/31/2023  5:09 PM ED from 03/31/2023 in [facility] Most recent reading at 03/31/2023  2:57 PM  C-SSRS RISK CATEGORY No Risk No Risk No Risk       Collaboration of Care: Collaboration of Care: Medication Management AEB active medication management and Psychiatrist AEB established with this provider  Patient/Guardian was advised Release of Information must be obtained prior to any record release in order to collaborate their care with an outside provider. Patient/Guardian was advised if they have not already done so to contact the registration department to sign all necessary forms in order for [place] to release information regarding their care.   Consent: Patient/Guardian gives verbal consent for treatment and assignment of benefits for services provided during this visit. Patient/Guardian expressed understanding and agreed to proceed.   Virtual Visit via Video Note  I connected with Phillip [name] on 02/14/24 at 10:00 AM EST by a video enabled telemedicine application and verified that I am speaking with the correct person using two identifiers.  Location: Patient: home address in [place] Provider: remote office in [place]   I discussed the limitations of evaluation and management by telemedicine and the availability of in person appointments. The patient expressed understanding and agreed to proceed.  I discussed the assessment and treatment plan with the patient. The patient was provided an opportunity to ask questions and  all were answered. The patient agreed with the plan and demonstrated an understanding of the instructions.   The patient was advised to call back or seek an in-person evaluation if the symptoms worsen or if the condition fails to improve as anticipated.  I provided 50 minutes dedicated to the care of this patient via video on the date of this encounter to include chart review, face-to-face time with the patient, medication management/counseling, collateral from brother, use of interpreter services.  Jacqeline Broers A Samiyyah Moffa 02/14/2024, 10:49 AM

## 2024-02-14 ENCOUNTER — Telehealth (HOSPITAL_COMMUNITY): Payer: MEDICAID | Admitting: Psychiatry

## 2024-02-14 ENCOUNTER — Encounter (HOSPITAL_COMMUNITY): Payer: Self-pay | Admitting: Psychiatry

## 2024-02-14 DIAGNOSIS — G809 Cerebral palsy, unspecified: Secondary | ICD-10-CM

## 2024-02-14 DIAGNOSIS — F411 Generalized anxiety disorder: Secondary | ICD-10-CM | POA: Diagnosis not present

## 2024-02-14 DIAGNOSIS — E559 Vitamin D deficiency, unspecified: Secondary | ICD-10-CM | POA: Diagnosis not present

## 2024-02-14 DIAGNOSIS — F319 Bipolar disorder, unspecified: Secondary | ICD-10-CM | POA: Diagnosis not present

## 2024-02-14 MED ORDER — DIVALPROEX SODIUM ER 500 MG PO TB24: ORAL_TABLET | ORAL | 2 refills | Status: DC

## 2024-02-14 MED ORDER — OLANZAPINE 20 MG PO TABS: 20.0000 mg | ORAL_TABLET | Freq: Every day | ORAL | 2 refills | Status: DC

## 2024-02-14 MED ORDER — PROPRANOLOL HCL 10 MG PO TABS
10.0000 mg | ORAL_TABLET | Freq: Two times a day (BID) | ORAL | 2 refills | Status: DC
Start: 2024-02-14 — End: 2024-04-02

## 2024-02-14 NOTE — Patient Instructions (Signed)

## 2024-02-16 ENCOUNTER — Telehealth (HOSPITAL_COMMUNITY): Payer: Self-pay | Admitting: *Deleted

## 2024-02-16 NOTE — Telephone Encounter (Signed)
Submitted a PA for patients Olanzapine. It has been approved till 02/15/2025. Pharmacy notified of approval.

## 2024-02-20 ENCOUNTER — Other Ambulatory Visit (HOSPITAL_COMMUNITY): Payer: Self-pay | Admitting: Psychiatry

## 2024-02-20 DIAGNOSIS — F319 Bipolar disorder, unspecified: Secondary | ICD-10-CM

## 2024-02-20 NOTE — Telephone Encounter (Signed)
 Hello,  Pt/pharmacy is now asking for a refill of OLANZAPINE 10 mg   60 tablets.

## 2024-02-20 NOTE — Telephone Encounter (Signed)
 I have not seen this patient since November 2024.  I have forwarded this message to Dr. Josephina Shih as she has seen him twice in the last few months.

## 2024-02-20 NOTE — Telephone Encounter (Signed)
 Sounds good thank you

## 2024-02-20 NOTE — Telephone Encounter (Signed)
 Hello Dr. Josephina Shih,  [facility] pharmacy, and pharmacy stated that Meds will be able to be processed after 5:00pm and that yes it is the 20-MG tablets that are being dispensed. Pt has not picked up meds yet stated by Pharmacists.

## 2024-03-29 NOTE — Progress Notes (Signed)
 BH MD Outpatient Progress Note  04/02/2024 4:04 PM [name]  MRN:  [id number]  Assessment:  Phillip [name] presents for follow-up evaluation. Today, 04/02/24, patient is seen with his mother who reports ongoing daytime sedation. He has started day programming and appears to be engaging well although motivation to attend and participation remains impacted by fatigue. He reports overall stability of mood without signs/sx of psychosis or agitation. Carefully reviewed medications during visit and had mother hold medications up to camera as she does not know them by name; upon further exploration she reports she has been giving Zyprexa and higher dose of Depakote in the morning. Encouraged change in timing of these medications so that sedating medications are given at night and teach back method used to confirm understanding. Patient is amenable to starting Vitamin D as well and as incorporating natural ways of increasing Vitamin D. Reminded patient's mother to obtain EKG through PCP before next appointment.   RTC in approx. 2 months in person.  Identifying Information: Phillip [name] is a 23 y.o. 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 Patient carries historical diagnosis of bipolar 1 disorder - per report from family patient experiences discrete mood episodes fluctuating between "active" states in which patient experiences marked mood lability, decreased need for sleep, impulsivity, psychomotor agitation, aggression towards self and others, and AH (although patient describes this as negative internal dialogue) and "inactive" states in which he sleeps excessively and appears oversedated. Most recently, he has been in an "inactive" state. Patient denies current symptoms concerning for depression, anxiety, or irritability and primarily  notes significant daytime fatigue. He has not had any episodes of aggression over the past month and denies symptoms of psychosis. It is likely that current medication regimen is contributing to fatigue and will work to consolidate medications (in particular reduce Zyprexa) as tolerated to minimize daytime sedation. Additionally, discussed importance of consistent sleep schedule and proper sleep hygiene with both patient and brother as he demonstrates delayed sleep phase.   Plan:  # Historical diagnosis of bipolar 1 disorder Past medication trials: Invega PO, Seroquel, Abilify PO and Maintena, amantadine, trazodone, Ambien Status of problem: stable Interventions: -- Continue Depakote ER 500 mg qAM + 1000 mg at bedtime; reviewed dosing schedule as currently taking 2 tablets in morning and 1 tablet at bedtime   -- If patient's behaviors recur, will pursue further titration of Depakote as tolerated -- Continue Zyprexa to 20 mg nightly; instructed to take at bedtime as currently taking in morning  -- If daytime sedation persists despite move to nighttime, will plan for gradual taper as tolerated -- Continue propranolol 10 mg twice daily  # Delayed sleep phase Past medication trials: melatonin (ineffective), trazodone, Ambien Status of problem: acute Interventions: -- Encouraged consistent sleep schedule -- Counseled against use of electronics prior to bedtime -- Can reconsider melatonin for circadian rhythm regulation; brother has declined given inefficacy in the past  # Cerebral palsy with developmental delay Status of problem: chronic Interventions: -- Followed by [facility] neurology with baseline left-sided weakness, nystagmus -- Continue day programming M-Thurs -- MRI brain 09/09/22 with no acute intracranial abnormality; demonstrated periventricular leukomalacia  # Vitamin D deficiency Status of problem: acute on chronic Interventions: -- Vit D 01/23/24 16.9: START Vitamin D3 2000 IU  daily -- Reviewed natural ways of supplementing Vitamin D including exposure to sunlight and  incorporating foods high yield for Vitamin D  # Medication monitoring Interventions: -- Zyprexa  -- Lipid profile revealing for elevated LDL (01/23/24)  -- HgbA1c wnl (01/23/24)  -- 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   Subjective:  Chief Complaint:  Chief Complaint  Patient presents with   Medication Management    Interval History:   Spoke with patient's mother first with consent of patient. Virtual Arabic interpreter utilized.  She reports that last month he was more irritable although not sure if this was related to Ramadan and fasting. Denies physical aggression/violence during this time.   Since Ramadan ended, he has returned to "inactive" state in which he is sleeping a lot. Little motivation and really only wanting to sleep. Takes a lot of encouragement to even get him to go on walks. Reports he is "sluggish" mentally and physically.  He has not made any comments of hurting himself or others; has not been seen RTIS.  Reports [name] is "sluggish" during the day and only wanting to sleep. Will return from school and go back to sleep from 4PM-7PM (only because woken up by mom). Then sleeps again from 11PM/12AM-8AM. Does not believe he is using electronics or screens during this time. Does sleep a bit better on nights following school.   Has started day program - no issues with peers or staff although  knows he may tend to sleep while at school. Talks to a therapist 1 hour weekly and has been fairly engaged with her. Does have to push him to go to school and he often pleads to go back to sleep. Day program schedule is Mon-Thurs 8AM-3PM and goes to prayer on Friday.   Reports adherence to psychiatric medications however upon reviewing medications realized that mom has been giving Zyprexa in the morning rather than bedtime. Discussed sedating effects of mediation and encouraged to move to nighttime. She reports she has also been giving Depakote 1000 qAM + 500 mg at bedtime and encouraged to flip this.   Did not start Vitamin D in Feb as had difficulty taking with food during Ramadan. Discussed can be given with/without food and at any time of day. Intends to restart.  Reminded of importance of obtaining updated EKG. She expresses intent to call family doctor to schedule appointment.   Spoke with patient alone. Virtual Arabic interpreter utilized.  He reports he is doing good and denies any concerns. He is taking [place] classes at school and enjoying this. Reports often doesn't want to go but mom encourages him to go to make friends. States he has made some friends.   Describes mood as "in the middle" and denies persistent sadness or irritability. Denies SI/HI, AVH. Appetite has been stable. Wants to sleep a lot and feels drowsy during the day. Has interest in doing things but feels too tired to do them. Would like to play basketball but has struggled with low energy.  Reviewed proposed medication changes and addition of Vitamin D to minimize  daytime sedation and help with energy. Reviewed natural ways of supplementing Vitamin D.   All questions/concerns addressed.   PDMP: -- Zolpidem 5 mg tablet QTY 60 last filled 11/07/23; fills dating back to May 2024  Visit Diagnosis:    ICD-10-CM   1. Cerebral palsy, unspecified type (HCC)  G80.9     2. Bipolar 1 disorder (HCC)  F31.9 divalproex  (DEPAKOTE ER) 500 MG 24 hr tablet    OLANZapine (ZYPREXA) 20 MG tablet    propranolol (INDERAL) 10 MG tablet    3. Vitamin D deficiency  E55.9     4. Generalized anxiety disorder  F41.1 propranolol (INDERAL) 10 MG tablet      Past Psychiatric History:  Diagnoses: Historical diagnosis of bipolar 1 disorder; developmental delay secondary to cerebral palsy Medication trials: Invega PO, Seroquel, Abilify PO and Maintena, amantadine, trazodone Hospitalizations: denies Suicide attempts: denies SIB: May bang head against wall during periods of agitation Hx of violence towards others: Has experienced aggression towards family and punching of walls during periods of agitation Substance use: denies use of etoh, illicit drugs, cannabis/CBD/THC, tobacco use Developmental: Full-term; hypoxia at delivery resulting in developmental delays  Past Medical History:  Past Medical History:  Diagnosis Date   Bipolar 1 disorder (HCC)    Cerebral palsy (HCC)    History reviewed. No pertinent surgical history.  Family Psychiatric History:  Brother: depression  Family History:  Family History  Problem Relation Age of Onset   Depression Brother     Social History:  Academic/Vocational: Attended special needs high school in [place]  Social History   Socioeconomic History   Marital status: Single    Spouse name: Not on file   Number of children: Not on file   Years of education: Not on file   Highest education level: Not on file  Occupational History   Not on file  Tobacco Use   Smoking status: Never   Smokeless tobacco: Never  Substance and Sexual Activity   Alcohol use: Never   Drug use: Never   Sexual activity: Not on file  Other Topics Concern   Not on file  Social History Narrative   Not on file   Social Drivers of Health   Financial Resource Strain: Not on file  Food Insecurity: No Food Insecurity (11/29/2022)   [facility] to 02/26/2023., [facility] prior to 02/26/2023[facility]   Hunger Vital Sign    Worried About Running Out of Food in the Last Year: Never true    Ran Out of Food in the Last Year: Never true  Transportation Needs: No Transportation Needs (11/29/2022)   [facility] from [facility] [employer], [facility] visits prior to 02/26/2023., [facility] visits prior to 02/26/2023., [facility]   PRAPARE - Transportation    Lack of Transportation (Medical): No    Lack of Transportation (Non-Medical): No  Physical Activity: Not on file  Stress: Not on file  Social Connections: Unknown (03/28/2023)   Received from [facility], [facility]   Social Network    Social Network: Not on file    Allergies: No Known Allergies  Current Medications: Current Outpatient Medications  Medication Sig Dispense Refill   Cholecalciferol (VITAMIN D3) 50 MCG (2000 UT) CAPS Take 2,000 Units by mouth daily. (Patient not taking: Reported on 04/02/2024)     divalproex (DEPAKOTE ER) 500 MG 24 hr tablet Take 1  tablet (500 mg total) by mouth in the morning AND 2 tablets (1,000 mg total) at bedtime. 90 tablet 2   OLANZapine (ZYPREXA) 20 MG tablet Take 1 tablet (20 mg total) by mouth at bedtime. 30 tablet 2   propranolol (INDERAL) 10 MG tablet Take 1 tablet (10 mg total) by mouth 2 (two) times daily. 60 tablet 2   No current facility-administered medications for this visit.    ROS: Reports oversedation  Objective:  Psychiatric Specialty Exam: There were no vitals taken for this visit.There is no height or weight on file to calculate BMI.  General Appearance: Casual, Neat, and Well Groomed  Eye Contact:  Fair; noted to have horizontal nystagmus  Speech:  Normal Rate and requires Arabic interpreter; improved spontaneity of speech  Volume:  Normal  Mood:   "normal"  Affect:   Euthymic, calm, more interactive this visit  Thought Content:  Denies  AVH; no overt delusional thought content on interview     Suicidal Thoughts:  No  Homicidal Thoughts:  No  Thought Process: Brief however overall linear/logical  Orientation:  Full (Time, Place, and Person)    Memory: Grossly intact  Judgment:  Fair  Insight:  Fair  Concentration:  Concentration: Good  Recall: not formally assessed   Fund of Knowledge: Fair  Language: Good  Psychomotor Activity:  Normal  Akathisia:  No  AIMS (if indicated): not done  Assets:  Communication Skills Desire for Improvement Housing Leisure Time Social Support Transportation Vocational/Educational  ADL's:  Intact; requires assistance with iADLs  Cognition: Concern for mild impairment  Sleep:   Oversleeping   PE: General: sits comfortably in view of camera; no acute distress  Pulm: no increased work of breathing on room air  MSK: all extremity movements appear intact  Neuro: Horizontal nystagmus Gait & Station: unable to assess by video   Metabolic Disorder Labs: Lab Results  Component Value Date   HGBA1C 4.8 01/23/2024   No results found for: "PROLACTIN" Lab Results  Component Value Date   CHOL 179 01/23/2024   TRIG 109 01/23/2024   HDL 48 01/23/2024   CHOLHDL 3.7 01/23/2024   LDLCALC 111 (H) 01/23/2024   No results found for: "TSH"  Therapeutic Level Labs: No results found for: "LITHIUM" Lab Results  Component Value Date   VALPROATE 50 01/23/2024   VALPROATE 15 (L) 04/01/2023   No results found for: "CBMZ"  Screenings:  GAD-7    Flowsheet Row Office Visit from 04/04/2023 in [facility]  Total GAD-7 Score 11      PHQ2-9    Flowsheet Row Office Visit from 04/04/2023 in [facility]  PHQ-2 Total Score 1  PHQ-9 Total Score 13      Flowsheet Row ED from 05/19/2023 in [facility] Most recent reading at 05/19/2023 12:52 PM ED from 03/31/2023 in [facility] Emergency Department at [facility] Most recent reading at 03/31/2023  5:09 PM ED from 03/31/2023 in [facility] Most recent reading at 03/31/2023  2:57 PM  C-SSRS RISK CATEGORY No Risk No Risk No Risk       Collaboration of Care: Collaboration of Care: Medication Management AEB active medication management and Psychiatrist AEB established with this provider  Patient/Guardian was advised Release of Information must be obtained prior to any record release in order to collaborate their care with an outside provider. Patient/Guardian was advised if they have not already done so to contact  the registration department to sign all necessary forms in order for [place] to release information regarding their care.   Consent: Patient/Guardian gives verbal consent for treatment and assignment of benefits for services provided during this visit. Patient/Guardian expressed understanding and agreed to proceed.   Virtual Visit via Video Note  I connected with Georg Rikard on 04/02/24 at  3:00 PM EDT by a video enabled telemedicine application and verified that I am speaking with the correct person using two identifiers.  Location: Patient: home address in [place] Provider: remote office in [place]   I discussed the limitations of evaluation and management by telemedicine and the availability of in person appointments. The patient expressed understanding and agreed to proceed.  I discussed the assessment and treatment plan with the patient. The patient was provided an opportunity to ask questions and all were answered. The patient agreed with the plan and demonstrated an understanding of the instructions.   The patient was advised to call back or seek an in-person evaluation if the symptoms worsen or if the condition fails to improve as anticipated.  I provided 80 minutes dedicated to the care of this patient via video on the date of this encounter to include chart review, face-to-face time with the patient, careful  medication review with medication management/counseling, collateral from mother, use of interpreter services.  Kindle Strohmeier A Trelyn Vanderlinde 04/02/2024, 4:04 PM

## 2024-04-02 ENCOUNTER — Encounter (HOSPITAL_COMMUNITY): Payer: Self-pay | Admitting: Psychiatry

## 2024-04-02 ENCOUNTER — Telehealth (HOSPITAL_COMMUNITY): Payer: MEDICAID | Admitting: Psychiatry

## 2024-04-02 DIAGNOSIS — G809 Cerebral palsy, unspecified: Secondary | ICD-10-CM | POA: Diagnosis not present

## 2024-04-02 DIAGNOSIS — G4721 Circadian rhythm sleep disorder, delayed sleep phase type: Secondary | ICD-10-CM

## 2024-04-02 DIAGNOSIS — E559 Vitamin D deficiency, unspecified: Secondary | ICD-10-CM

## 2024-04-02 DIAGNOSIS — F411 Generalized anxiety disorder: Secondary | ICD-10-CM

## 2024-04-02 DIAGNOSIS — F319 Bipolar disorder, unspecified: Secondary | ICD-10-CM

## 2024-04-02 MED ORDER — DIVALPROEX SODIUM ER 500 MG PO TB24
ORAL_TABLET | ORAL | 2 refills | Status: DC
Start: 2024-04-02 — End: 2024-06-19

## 2024-04-02 MED ORDER — OLANZAPINE 20 MG PO TABS: 20.0000 mg | ORAL_TABLET | Freq: Every day | ORAL | 2 refills | Status: DC

## 2024-04-02 MED ORDER — PROPRANOLOL HCL 10 MG PO TABS
10.0000 mg | ORAL_TABLET | Freq: Two times a day (BID) | ORAL | 2 refills | Status: DC
Start: 2024-04-02 — End: 2024-06-19

## 2024-04-02 NOTE — Patient Instructions (Addendum)
 Thank you for attending your appointment today.  -- Move olanzapine to nighttime -- SWITCH dosing of depakote so that you take 1 tablet in the morning and 2 tablets at nighttime -- Continue other medications as prescribed.  Please do not make any changes to medications without first discussing with your provider. If you are experiencing a psychiatric emergency, please call 911 or present to your nearest emergency department. Additional crisis, medication management, and therapy resources are included below.  [facility]  [address], [place], [place] [address] [phone] WALK-IN URGENT CARE 24/7 FOR ANYONE [address] [place], [place]  [phone] Fax: [phone] [email or web address] *Interpreters available *Accepts all insurance and uninsured for Urgent Care needs *Accepts Medicaid and uninsured for outpatient treatment (below)      ONLY FOR [facility]  Below:    Outpatient New Patient Assessment/Therapy Walk-ins:        Monday, Wednesday, and Thursday 8am until slots are full (first come, first served)                   New Patient Psychiatry/Medication Management        Monday-Friday 8am-11am (first come, first served)               For all walk-ins we ask that you arrive by 7:15am, because patients will be seen in the order of arrival.

## 2024-05-08 ENCOUNTER — Other Ambulatory Visit (HOSPITAL_COMMUNITY): Payer: Self-pay | Admitting: Psychiatry

## 2024-05-08 DIAGNOSIS — F319 Bipolar disorder, unspecified: Secondary | ICD-10-CM

## 2024-06-14 NOTE — Progress Notes (Unsigned)
 BH MD Outpatient Progress Note  06/19/2024 7:55 PM [name]  MRN:  [id number]  Assessment:  Phillip [name] presents for follow-up evaluation. Today, 06/19/24, patient is seen with his parents and reports overall stability of mood without any episodes of behavioral dysregulation or aggression. Primary concern today is persistent daytime fatigue despite moving more sedating medications to nighttime (confirmed with medication bottles brought to appointment today). It is likely that poor sleep hygiene is in part contributing to this however concern that high dose of Zyprexa  is also leading to residual daytime sedation. Given sustained stability of mood and use of dual mood stabilizers, will pursue conservative reduction in Zyprexa  as below. Mom also notes concern for weight gain which may be mitigated by Zyprexa  reduction as well. Should patient experience re-emergent mood symptoms, would likely optimize Depakote  given historical benefit from this medication. Reminded patient's parents of need to obtain EKG through PCP and they were provided with number to PCP office.  RTC in approx. 2 months by video.  Identifying Information: Phillip [name] is a 23 y.o. 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 Patient carries historical diagnosis of bipolar 1 disorder - per report from family patient experiences discrete mood episodes fluctuating between active states in which patient experiences marked mood lability, decreased need for sleep, impulsivity, psychomotor agitation, aggression towards self and others, and AH (although patient describes this as negative internal dialogue) and inactive states in which he sleeps excessively and appears oversedated. Most recently, he has been in an inactive state. Patient denies current symptoms  concerning for depression, anxiety, or irritability and primarily notes significant daytime fatigue. He has not had any episodes of aggression over the past month and denies symptoms of psychosis. It is likely that current medication regimen is contributing to fatigue and will work to consolidate medications (in particular reduce Zyprexa ) as tolerated to minimize daytime sedation. Additionally, discussed importance of consistent sleep schedule and proper sleep hygiene with both patient and family as he demonstrates delayed sleep phase.   Plan:  # Historical diagnosis of bipolar 1 disorder Past medication trials: Invega  PO, Seroquel , Abilify  PO and Maintena, amantadine , trazodone , Ambien  Status of problem: stable Interventions: -- Continue Depakote  ER 500 mg qAM + 1000 mg at bedtime  -- If patient's behaviors recur, will pursue further titration of Depakote  as tolerated -- DECREASE Zyprexa  to 15 mg nightly to reduce daytime fatigue (d6/24/25) -- Continue propranolol  10 mg twice daily  # Delayed sleep phase Past medication trials: melatonin (ineffective), trazodone , Ambien  Status of problem: acute Interventions: -- Encouraged consistent sleep schedule -- Counseled against use of electronics prior to bedtime; partnered with patient and parents to set cutoff time of 11PM -- Can reconsider melatonin for circadian rhythm regulation; brother has declined given inefficacy in the past  # Cerebral palsy with developmental delay Status of problem: chronic Interventions: -- Followed by [facility] neurology with baseline left-sided weakness, nystagmus -- Continue day programming M-Thurs -- MRI brain 09/09/22 with no acute intracranial abnormality; demonstrated periventricular leukomalacia  # Vitamin D  deficiency Status of problem: acute on chronic Interventions: -- Vit D 01/23/24 16.9: Patient has started Vitamin D3 1000 IU daily; instructed to increase to 2000 IU daily (s6/24/25) -- Reviewed natural ways  of supplementing Vitamin D  including exposure to sunlight and incorporating foods high yield for Vitamin D   # Medication monitoring Interventions: -- Zyprexa   --  Lipid profile revealing for elevated LDL (01/23/24)  -- HgbA1c wnl (01/23/24)  -- EKG 04/01/23: NSR however report indicates WPW which is not mentioned in chart; Qtc 478   -- This writer previously communicated with PCP's office to get EKG which has been ordered; reminded patient's parents to get EKG done before next appointment and provided with number to PCP office -- Depakote :  -- CMP wnl 03/31/23  -- CBC wnl 03/31/23  -- Depakote  level 50 01/23/24 (true trough is likely lower than this level given time of draw and ER formulation)    Patient was given contact information for behavioral health clinic and was instructed to call 911 for emergencies.   Subjective:  Chief Complaint:  Chief Complaint  Patient presents with   Medication Management    Interval History:   Patient provides permission for his mother and father to be present for visit. Arabic interpreter utilized.  Skip reports he has been participating in day program and meeting new people; has been taking some English classes online but notes the communication barrier at day program makes it hard to participate sometimes.   Endorses ongoing daytime fatigue and notes this is worsened when he goes to sleep late. May stay up late on his phone watching videos and on facebook. Sometimes stays up as late as 12PM-1AM. On nights he goes to bed at a normal time, feels energy is better. Sleeping 7-8 hours on average. Dad states he continues to sleep a lot during the day and hard to wake him up in the morning. Really no improvement when they adjusted more sedating medications to be given at night. Confirm that he is taking Vitamin D . om states he has been napping at day program and once he gets home from school.   [name] describes mood as good - denies feeling persistently depressed,  down, irritable, or anxious. Denies elevated mood or excessive energy; risky/impulsive behaviors. Denies aggression. Denies SI/HI. Denies AVH. Appetite is stable.   Explored with patient and parents setting restriction on phone time at night - he identifies 11PM would be a reasonable cutoff time. Discussed avoiding naps during the day to build up sleep deficit. Substantial portion of visit dedicated to reviewing sleep hygiene.  Mom reports concern that Klaus has gained weight but disproportionately in his legs. Appetite has perhaps been a bit higher.   Due to ongoing sedation and stability of mood, amenable to reduction in olanzapine  to 15 mg nightly. Counseled on signs/sx of re-emergence of mood symptoms and encouraged patient/family to reach out if concerns before next appt.  Mom reports patient will be having eye surgery for strabismus in July; inquires into safety of medications with general anesthesia. Discussed okay to hold morning medications if recommended by anesthesia however otherwise would continue medications as prescribed. Counseled to further discuss with surgery team and reach out to this provider if concerns are raised.   PDMP: -- Zolpidem  5 mg tablet QTY 60 last filled 11/07/23; fills dating back to May 2024  Visit Diagnosis:    ICD-10-CM   1. Bipolar 1 disorder (HCC)  F31.9 OLANZapine  (ZYPREXA ) 15 MG tablet    divalproex  (DEPAKOTE  ER) 500 MG 24 hr tablet    propranolol  (INDERAL ) 10 MG tablet    2. Generalized anxiety disorder  F41.1 propranolol  (INDERAL ) 10 MG tablet    3. Cerebral palsy, unspecified type (HCC)  G80.9     4. Vitamin D  deficiency  E55.9      Past Psychiatric History:  Diagnoses: Historical  diagnosis of bipolar 1 disorder; developmental delay secondary to cerebral palsy Medication trials: Invega  PO, Seroquel , Abilify  PO and Maintena, amantadine , trazodone  Hospitalizations: denies Suicide attempts: denies SIB: May bang head against wall during periods  of agitation Hx of violence towards others: Has experienced aggression towards family and punching of walls during periods of agitation Substance use: denies use of etoh, illicit drugs, cannabis/CBD/THC, tobacco use Developmental: Full-term; hypoxia at delivery resulting in developmental delays  Past Medical History:  Past Medical History:  Diagnosis Date   Bipolar 1 disorder (HCC)    Cerebral palsy (HCC)    History reviewed. No pertinent surgical history.  Family Psychiatric History:  Brother: depression  Family History:  Family History  Problem Relation Age of Onset   Depression Brother     Social History:  Academic/Vocational: Attended special needs high school in [place]  Social History   Socioeconomic History   Marital status: Single    Spouse name: Not on file   Number of children: Not on file   Years of education: Not on file   Highest education level: Not on file  Occupational History   Not on file  Tobacco Use   Smoking status: Never   Smokeless tobacco: Never  Substance and Sexual Activity   Alcohol use: Never   Drug use: Never   Sexual activity: Not on file  Other Topics Concern   Not on file  Social History Narrative   Not on file   Social Drivers of Health   Financial Resource Strain: Not on file  Food Insecurity: No Food Insecurity (11/29/2022)   Received [facility], [facility] visits prior to 02/26/2023.   Hunger Vital Sign    Worried About Running Out of Food in the Last Year: Never true    Ran Out of Food in the Last Year: Never true  Transportation Needs: No Transportation Needs (11/29/2022)   Received from [facility], [facility] visits prior to 02/26/2023.   PRAPARE - Administrator, Civil Service (Medical): No    Lack of Transportation (Non-Medical): No  Physical Activity: Not on file  Stress: Not on file  Social Connections: Unknown (03/28/2023)   Received from [facility]    Social Network    Social Network: Not on file    Allergies: No Known Allergies  Current Medications: Current Outpatient Medications  Medication Sig Dispense Refill   Cholecalciferol (VITAMIN D3) 50 MCG (2000 UT) CAPS Take 2,000 Units by mouth daily. (Patient taking differently: Take 1,000 Units by mouth daily.)     divalproex  (DEPAKOTE  ER) 500 MG 24 hr tablet Take 1 tablet (500 mg total) by mouth in the morning AND 2 tablets (1,000 mg total) at bedtime. 90 tablet 2   OLANZapine  (ZYPREXA ) 15 MG tablet Take 1 tablet (15 mg total) by mouth at bedtime. 30 tablet 2   propranolol  (INDERAL ) 10 MG tablet Take 1 tablet (10 mg total) by mouth 2 (two) times daily. 60 tablet 2   No current facility-administered medications for this visit.    ROS: Reports oversedation; weight gain  Objective:  Psychiatric Specialty Exam: There were no vitals taken for this visit.There is no height or weight on file to calculate BMI.  General Appearance: Casual, Neat, and Well Groomed  Eye Contact:  Fair; noted to have horizontal nystagmus  Speech:  Normal Rate and requires Arabic interpreter; improved spontaneity of speech  Volume:  Normal  Mood:  good  Affect:  Euthymic, calm, pleasant  Thought Content: Denies AVH; no overt delusional thought content on interview    Suicidal Thoughts:  No  Homicidal Thoughts:  No  Thought Process: Brief however overall linear/logical  Orientation:  Full (Time, Place, and Person)    Memory: Grossly intact  Judgment:  Fair  Insight:  Fair  Concentration:  Concentration: Good  Recall: not formally assessed   Fund of Knowledge: Fair  Language: Good  Psychomotor Activity:  Normal  Akathisia:  No  AIMS (if indicated): not done  Assets:  Communication Skills Desire for Improvement Housing Leisure Time Social Support Transportation Vocational/Educational  ADL's:  Intact; requires assistance with iADLs  Cognition: Concern for mild impairment  Sleep:  Oversleeping    PE: General: well-appearing; no acute distress  Pulm: no increased work of breathing on room air  Strength & Muscle Tone: within normal limits Neuro: Horizontal nystagmus  Gait & Station: normal  Metabolic Disorder Labs: Lab Results  Component Value Date   HGBA1C 4.8 01/23/2024   No results found for: PROLACTIN Lab Results  Component Value Date   CHOL 179 01/23/2024   TRIG 109 01/23/2024   HDL 48 01/23/2024   CHOLHDL 3.7 01/23/2024   LDLCALC 111 (H) 01/23/2024   No results found for: TSH  Therapeutic Level Labs: No results found for: LITHIUM Lab Results  Component Value Date   VALPROATE 50 01/23/2024   VALPROATE 15 (L) 04/01/2023   No results found for: CBMZ  Screenings:  GAD-7    Flowsheet Row Office Visit from 04/04/2023 in [facility]  Total GAD-7 Score 11   PHQ2-9    Flowsheet Row Office Visit from 04/04/2023 in [facility]  PHQ-2 Total Score 1  PHQ-9 Total Score 13   Flowsheet Row ED from 05/19/2023 in [facility] Most recent reading at 05/19/2023 12:52 PM ED from 03/31/2023 in [facility] Most recent reading at 03/31/2023  5:09 PM ED from 03/31/2023 in [facility] Most recent reading at 03/31/2023  2:57 PM  C-SSRS RISK CATEGORY No Risk No Risk No Risk    Collaboration of Care: Collaboration of Care: Medication Management AEB active medication management and Psychiatrist AEB established with this provider  Patient/Guardian was advised Release of Information must be obtained prior to any record release in order to collaborate their care with an outside provider. Patient/Guardian was advised if they have not already done so to contact the registration department to sign all necessary forms in order for us  to release information regarding their care.   Consent: Patient/Guardian gives verbal consent for  treatment and assignment of benefits for services provided during this visit. Patient/Guardian expressed understanding and agreed to proceed.   A total of 60 minutes was spent involved in face to face clinical care, chart review, documentation, brief motivational interviewing, medication management, use of interpreter services.   Psychotherapy was utilized during today's session from 2:45-3:10PM. Therapeutic interventions included empathic listening, behavioral therapy, CBT-I. Recommendations made for sleep hygiene.  Improvement was evidenced by patient's participation and identified commitment to therapy goals.   Elan Mcelvain A Leanord Thibeau 06/19/2024, 7:55 PM

## 2024-06-19 ENCOUNTER — Encounter (HOSPITAL_COMMUNITY): Payer: Self-pay | Admitting: Psychiatry

## 2024-06-19 ENCOUNTER — Ambulatory Visit (INDEPENDENT_AMBULATORY_CARE_PROVIDER_SITE_OTHER): Payer: MEDICAID | Admitting: Psychiatry

## 2024-06-19 DIAGNOSIS — G809 Cerebral palsy, unspecified: Secondary | ICD-10-CM

## 2024-06-19 DIAGNOSIS — E559 Vitamin D deficiency, unspecified: Secondary | ICD-10-CM

## 2024-06-19 DIAGNOSIS — F319 Bipolar disorder, unspecified: Secondary | ICD-10-CM | POA: Diagnosis not present

## 2024-06-19 DIAGNOSIS — F411 Generalized anxiety disorder: Secondary | ICD-10-CM | POA: Diagnosis not present

## 2024-06-19 MED ORDER — OLANZAPINE 15 MG PO TABS: 15.0000 mg | ORAL_TABLET | Freq: Every day | ORAL | 2 refills | Status: DC

## 2024-06-19 MED ORDER — DIVALPROEX SODIUM ER 500 MG PO TB24: ORAL_TABLET | ORAL | 2 refills | Status: DC

## 2024-06-19 MED ORDER — PROPRANOLOL HCL 10 MG PO TABS: 10.0000 mg | ORAL_TABLET | Freq: Two times a day (BID) | ORAL | 2 refills | Status: DC

## 2024-06-19 NOTE — Patient Instructions (Addendum)
 Thank you for attending your appointment today.  -- DECREASE Zyprexa  to 15 mg nightly -- Continue other medications as prescribed. -- Please call your primary care doctor at [phone] to schedule a follow up appointment. Please ask them to schedule you for an EKG.  Please do not make any changes to medications without first discussing with your provider. If you are experiencing a psychiatric emergency, please call 911 or present to your nearest emergency department. Additional crisis, medication management, and therapy resources are included below.  [facility]  [address] [place], [place] [address] [phone] WALK-IN URGENT CARE 24/7 FOR ANYONE [address] [place], [place]  [phone] Fax: [phone] [email or web address] *Interpreters available *Accepts all insurance and uninsured for Urgent Care needs *Accepts Medicaid and uninsured for outpatient treatment (below)      ONLY FOR [facility]  Below:    Outpatient New Patient Assessment/Therapy Walk-ins:        Monday, Wednesday, and Thursday 8am until slots are full (first come, first served)                   New Patient Psychiatry/Medication Management        Monday-Friday 8am-11am (first come, first served)               For all walk-ins we ask that you arrive by 7:15am, because patients will be seen in the order of arrival.

## 2024-06-26 ENCOUNTER — Other Ambulatory Visit: Payer: Self-pay

## 2024-06-26 ENCOUNTER — Encounter (HOSPITAL_BASED_OUTPATIENT_CLINIC_OR_DEPARTMENT_OTHER): Payer: Self-pay | Admitting: Ophthalmology

## 2024-06-27 NOTE — Progress Notes (Addendum)
 [facility] PRIMARY [facility] ADULT [facility] INTERNAL MEDICINE RETURN PATIENT VISIT Encounter Provider: Roxana Matsu, MD PCP: Lillia Fitzpatrick, MD Preceptor: Preceptor: Dr. Rosine CC: Foot Swelling (Swelling in both feet for about 2 months)   Assessment & Plan:  Phillip [name] is a 23 y.o. male who presents for a return patient visit today. Medical decision making as follows: Reports swelling in bilateral lower extremities. Denies chest pain, heart flutters, palpitations, shortness of breath, abdominal pain.   #Bilateral lower extremity swelling -Reports 2 months of swelling which has worsened over time; denies chest pain or shortness of breath; reports swelling remains the same regardless of timing in the day and not changed with activity -No pitting edema appreciated on physical examination -Gained 12 lbs since visit in January 2025; seems more likely due to weight gain in setting of olanzapine  PLAN: -BMP and A1C ordered -Continue to monitor - if worsens consider echo -Advised to use compression socks and elevation of legs while lying down to decrease swelling in lower extremities -Appointment with Dr. 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 Lauraine Pummel -Current regimen: Depakote  500 mg once in the AM and 2 pills in thevening, zyprexa  5 mg AM and 10 mg at bedtime, propranolol  10 mg PLAN: -Continue current regimen  #Cerebral Palsy -Managed by neurology at [facility]  #Malocclusion of teeth -Followed by Dentistry, [facility], and plastics  #Bilateral Strabismus Surgery -Scheduled on 7/9 at [facility] PLAN: -Advised to contact ophthalmology to hold off on the surgery due to new onset  WPW   Orders:  Orders Placed This Encounter  Procedures  . Comprehensive Metabolic Panel  . Hemoglobin A1C With Estimated Average Glucose  . ECG 12 lead    History of Present Illness:  Phillip [name] is a 23 y.o. male who presents for a return patient visit today. Issues and addressed today are as follows:  Brother reports patient has had worsening swelling in his bilateral lower extremities over the past two months. Patient denies chest pain, shortness of breath, abdominal pain. No other concerns at this time. Reports weight gain.  Brother reports patient has eye surgery scheduled for next week and would like to know if it is ok to get the surgery as planned in the setting of the lower extremity edema.  Reports no new medication additions since last visit. Reports patient is doing well emotionally, bipolar disorder managed well by psychiatrist.  Used Recruitment consultant.  Objective:  Physical Exam: BP 128/72 (BP Location: Left arm, Patient Position: Sitting)   Pulse 74   Temp 99.1 F (37.3 C) (Oral)   Resp 16   Ht 1.702 m (5' 7)   Wt 83 kg (182 lb 14.4 oz)   SpO2 100%   BMI 28.65 kg/m  CONSTITUTIONAL: well developed, nourished, no distress CARDIOVASCULAR: heart sounds normal, normal rate, and regular rhythm PULMONARY/CHEST WALL: breath sounds normal and effort normal, mild pain on inspiration in the R side ABDOMINAL: appearance normal, bowel sounds normal, and soft, non-distended, non-tender MUSCULOSKELETAL: no pitting edema bilaterally in lower extremities SKIN: dry, warm, intact  Current Outpatient Medications  Medication Instructions  . divalproex  (DEPAKOTE ) 500 mg, oral, 1 in the morning and 2 pills in the evening  . OLANZapine  (  ZyPREXA ) 10 mg tablet 1 tablet, At bedtime  . OLANZapine  (ZYPREXA ) 5 mg, Daily  . propranoloL  (INDERAL ) 10 mg   Electronically signed by: Roxana Matsu, MD 06/27/2024 4:54 PM

## 2024-06-28 ENCOUNTER — Telehealth (HOSPITAL_COMMUNITY): Payer: Self-pay | Admitting: *Deleted

## 2024-06-28 NOTE — Telephone Encounter (Signed)
 Call from Dr Waldo re shared patient. Pt had presented to them with complaint of feet swelling. Hospital did an EKG and found he had an arhythmia called wolf parkinson white and the Dr noted he was on Inderal  prescribed by Dr Mercy so she wanted to inform her about this finding. I told Wake Dr I would document this and forward note to the provider. The patients brother had asked them to notify us .

## 2024-07-01 ENCOUNTER — Encounter (HOSPITAL_BASED_OUTPATIENT_CLINIC_OR_DEPARTMENT_OTHER): Payer: Self-pay | Admitting: Ophthalmology

## 2024-07-01 NOTE — Progress Notes (Signed)
 I saw and evaluated the patient and reviewed the resident's note. I agree with the resident's findings and plan.   Presented for months of LE edema in setting of upcoming strabismus surgery. No clear edema noted on exam. EKG acquired given request of psychiatrist in s/o recent antipsychotic changes and showed WPW pattern. No reported palpitations, SOB but history limited given underlying CP, communication deficits. Had long discussion with patient's father and brother regarding diagnosis and risk of arrhythmia. Advised to hold propranolol  given risk of AV nodal blockers in WPW. Advised to call psychiatrist to inform her and Dr. Durel will also try to reach her with this change. Advised to hold off on upcoming elective strabismus surgery pending EP consultation. Did discuss with EP by phone - recommend zio and echo and consultation with them. Has close f/u with PCP to monitor leg swelling. Strict ED precautions given.

## 2024-07-01 NOTE — H&P (Signed)
 Phillip [name] is an 22 y.o. male.   Chief Complaint:  My eyes shake and I have to turn my head to see better . HPI: 23 y.o. 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  Past Medical History:  Diagnosis Date   Bipolar 1 disorder (HCC)    Cerebral palsy (HCC)    pt is ambulatory    Past Surgical History:  Procedure Laterality Date   TONSILLECTOMY      Family History  Problem Relation Age of Onset   Depression Brother    Social History:  reports that he has never smoked. He has never used smokeless tobacco. He reports that he does not drink alcohol and does not use drugs.  Allergies: No Known Allergies  No medications prior to admission.    No results found for this or any previous visit (from the past 48 hours). No results found.  Review of Systems  Constitutional: Negative.   HENT: Negative.    Eyes:        Congenital  Horizontal jerk Nystagmus ;   Respiratory: Negative.    Cardiovascular: Negative.   Gastrointestinal: Negative.   Endocrine: Negative.   Genitourinary: Negative.   Musculoskeletal: Negative.   Skin: Negative.   Neurological:        Hx of mild CP c some cognitive developmental delay.  Hematological: Negative.   Psychiatric/Behavioral:         Bipolar disorder     Height 5' 4 (1.626 m), weight 72.1 kg. Physical Exam Constitutional:      Appearance: Normal appearance. He is normal weight.  HENT:     Head: Normocephalic and atraumatic.     Nose: Nose normal.  Eyes:     Extraocular Movements: Extraocular movements intact.      Comments: Horizontal  Jerk Nystagmus c torticollis .  Cardiovascular:     Rate and Rhythm: Normal rate and regular rhythm.  Pulmonary:     Effort: Pulmonary effort is normal.  Abdominal:     General: Abdomen is flat.  Musculoskeletal:        General:  Normal range of motion.     Cervical back: Normal range of motion.  Skin:    General: Skin is warm.  Neurological:     General: No focal deficit present.     Mental Status: He is alert.  Psychiatric:        Mood and Affect: Mood normal.        Behavior: Behavior normal.      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 : LMR Recess: LLR resection:  RMR resection : LLR recess : under general  anesthesia.  Ozell Mirza, MD 07/01/2024, 1:55 PM

## 2024-07-02 ENCOUNTER — Telehealth (HOSPITAL_COMMUNITY): Payer: Self-pay

## 2024-07-02 NOTE — Telephone Encounter (Signed)
 error

## 2024-07-02 NOTE — Telephone Encounter (Signed)
 Patient called in to due to recent visit with PCP. Patient's primary care provider is requesting patient to stop or change his Propranolol  10 mg with provider's permission. Due to a new finding wit his heart. Pt is requesting a call with provider before a his eye surgery on 07/04/24.

## 2024-07-02 NOTE — Telephone Encounter (Signed)
 Sruthie - were you able to reach his psychiatrist? If not, I can try this week. Thanks!

## 2024-07-03 ENCOUNTER — Telehealth (HOSPITAL_COMMUNITY): Payer: Self-pay | Admitting: Psychiatry

## 2024-07-03 NOTE — Telephone Encounter (Signed)
 Received message from patient's brother, Phillip [name], wanting to discuss recent PCP appointment.  Spoke with Phillip [name]'s brother with assistance from [facility] phone interpreter for approx. 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 PCP recommended stopping propranolol . Brother reports they have decreased to 10 mg daily but did not discontinue due to fears around how discontinuation would impact mental health.   Discussed that discontinuation of propranolol  will likely be well tolerated from mental health standpoint - while propranolol  can be helpful for impulse control, anxiety, and irritability, patient is on two other medications (Zyprexa , Depakote ) that provide these benefits as well. Recommended discontinuation of propranolol  if that is what is recommended by PCP from medical perspective (confirmed in chart that PCP had concern for propranolol  worsening cardiac arrhythmia).   Cardiology appt scheduled 8/4. Ophtho surgery rescheduled to 8/13.  Encouraged family to discuss with cardiologist safety of both olanzapine  and Depakote  from cardiac perspective. On literature review, olanzapine  is felt to pose lower risk for Qtc prolongation. Notably, concern for WPW was first noted on EKG in April 2024 predating use of olanzapine . Olanzapine  has recently been decreased however would not recommend discontinuation of these agents unless advised by cardiology or more urgent medical concerns.   This Clinical research associate has reached out to PCP through phone and secure message in case further discussion is needed.  LAURAINE DELENA PUMMEL, MD 07/03/24

## 2024-07-04 ENCOUNTER — Encounter (HOSPITAL_BASED_OUTPATIENT_CLINIC_OR_DEPARTMENT_OTHER)
Admission: RE | Disposition: A | Discharge status: Home / Self Care | Payer: Self-pay | Source: Home / Self Care | Attending: Ophthalmology

## 2024-07-04 DIAGNOSIS — I456 Pre-excitation syndrome: Secondary | ICD-10-CM | POA: Insufficient documentation

## 2024-07-04 SURGERY — STRABISMUS SURGERY, BILATERAL
Anesthesia: General | Laterality: Bilateral

## 2024-07-12 NOTE — Progress Notes (Addendum)
 [name] was seen in the office on 06/27/2024 for concerns of bilateral lower extremity swelling over the past 2 months which had worsened over time. Patient denied chest pain, palpitations, shortness of breath, and abdominal pain. Denied orthopnea and reported the swelling was the same regardless of the time of day or activity. No pitting edema was appreciated on physical exam.  Additionally, an EKG was performed due to recommendations of patient's psychiatrist, Dr. Lauraine Pummel, due to use of Zyprexa  5 mg daily for Bipolar I disorder.  EKG during office visit showed findings of WPW, seen below. Patient was note to have Wolff-Parkinson-White. No previous EKGs on file for this patient.    Propranolol  10 mg was held in the setting of newly found WPW and I called Dr. Pummel and left a message regarding this medication change. Dr. Pummel spoke with patient's brother who reported they did not discontinue propranolol  due to concerns regarding how it would affect his mental health. Dr. Pummel advised family to discontinue propranolol  due to concerns of worsening cardiac arrhythmia.   Given an urgent referral to EP for further assessment and patient advised to notify ophthalmologist as patient's strabismus surgery needed to be postponed until the arrhythmia could be further evaluated. Surgery was subsequently rescheduled to 08/13/2024.  Based on EP recommendations, a transthoracic echo was ordered to evaluate for a structural cause of WPW as well as further guide management, such as need for ablation. Zio monitor was ordered to further evaluate arrhythmia. EP recommended a Lexiscan stress to evaluate for ischemic causes of chest discomfort.  Summary of the results of the TTE are as follows: A two-dimensional transthoracic echocardiogram with color flow and Doppler  was performed. Image  Quality  Fair.  The left ventricular size is normal.  There is normal left ventricular wall thickness.   Left ventricular systolic function is normal.  LV ejection fraction = 55-60%.  There is no significant valvular stenosis or regurgitation.  There is no pericardial effusion.  IVC size was normal.  The ascending aorta is normal size.  There is no comparison study available

## 2024-07-24 ENCOUNTER — Encounter (HOSPITAL_COMMUNITY): Payer: MEDICAID | Admitting: Psychiatry

## 2024-08-01 ENCOUNTER — Encounter (HOSPITAL_BASED_OUTPATIENT_CLINIC_OR_DEPARTMENT_OTHER): Payer: Self-pay | Admitting: Ophthalmology

## 2024-08-03 NOTE — Telephone Encounter (Signed)
 Used interpreter ID #[id number]. Called and notified patient of stress test results. Patient will call back if he needs specific pre-op clearance form.

## 2024-08-06 NOTE — Progress Notes (Signed)
 Patient had stress test and zio monitor for clearance both low Risk Studies. Reviewed with Dr. Peggye at [facility] and procedure is okay to proceed as scheduled.

## 2024-08-07 ENCOUNTER — Encounter (HOSPITAL_BASED_OUTPATIENT_CLINIC_OR_DEPARTMENT_OTHER): Payer: Self-pay | Admitting: Ophthalmology

## 2024-08-08 ENCOUNTER — Ambulatory Visit (HOSPITAL_BASED_OUTPATIENT_CLINIC_OR_DEPARTMENT_OTHER)
Admission: RE | Admit: 2024-08-08 | Discharge: 2024-08-08 | Disposition: A | Discharge status: Home / Self Care | Payer: MEDICAID | Attending: Ophthalmology | Admitting: Ophthalmology

## 2024-08-08 ENCOUNTER — Ambulatory Visit (HOSPITAL_BASED_OUTPATIENT_CLINIC_OR_DEPARTMENT_OTHER): Payer: MEDICAID | Admitting: Anesthesiology

## 2024-08-08 ENCOUNTER — Encounter (HOSPITAL_BASED_OUTPATIENT_CLINIC_OR_DEPARTMENT_OTHER): Payer: Self-pay | Admitting: Ophthalmology

## 2024-08-08 ENCOUNTER — Other Ambulatory Visit: Payer: Self-pay

## 2024-08-08 ENCOUNTER — Encounter (HOSPITAL_BASED_OUTPATIENT_CLINIC_OR_DEPARTMENT_OTHER)
Admission: RE | Disposition: A | Discharge status: Home / Self Care | Payer: Self-pay | Source: Home / Self Care | Attending: Ophthalmology

## 2024-08-08 DIAGNOSIS — G809 Cerebral palsy, unspecified: Secondary | ICD-10-CM | POA: Diagnosis not present

## 2024-08-08 DIAGNOSIS — F319 Bipolar disorder, unspecified: Secondary | ICD-10-CM | POA: Insufficient documentation

## 2024-08-08 DIAGNOSIS — H5501 Congenital nystagmus: Secondary | ICD-10-CM

## 2024-08-08 DIAGNOSIS — I456 Pre-excitation syndrome: Secondary | ICD-10-CM | POA: Insufficient documentation

## 2024-08-08 DIAGNOSIS — M436 Torticollis: Secondary | ICD-10-CM | POA: Insufficient documentation

## 2024-08-08 DIAGNOSIS — F419 Anxiety disorder, unspecified: Secondary | ICD-10-CM

## 2024-08-08 DIAGNOSIS — G709 Myoneural disorder, unspecified: Secondary | ICD-10-CM | POA: Diagnosis not present

## 2024-08-08 HISTORY — DX: Pre-excitation syndrome: I45.6

## 2024-08-08 HISTORY — PX: STRABISMUS SURGERY: SHX218

## 2024-08-08 SURGERY — STRABISMUS SURGERY, BILATERAL
Anesthesia: General | Site: Eye | Laterality: Bilateral

## 2024-08-08 MED ORDER — DEXAMETHASONE SODIUM PHOSPHATE 10 MG/ML IJ SOLN
INTRAMUSCULAR | Status: AC
  Filled 2024-08-08: qty 1

## 2024-08-08 MED ORDER — MIDAZOLAM HCL 2 MG/2ML IJ SOLN
INTRAMUSCULAR | Status: AC
  Filled 2024-08-08: qty 2

## 2024-08-08 MED ORDER — DEXMEDETOMIDINE HCL IN NACL 80 MCG/20ML IV SOLN
INTRAVENOUS | Status: DC | PRN
  Administered 2024-08-08: 8 ug via INTRAVENOUS
  Administered 2024-08-08 (×5): 4 ug via INTRAVENOUS
  Administered 2024-08-08: 8 ug via INTRAVENOUS
  Administered 2024-08-08: 4 ug via INTRAVENOUS

## 2024-08-08 MED ORDER — BSS IO SOLN
INTRAOCULAR | Status: DC | PRN
  Administered 2024-08-08 (×2): 15 mL

## 2024-08-08 MED ORDER — ONDANSETRON HCL 4 MG/2ML IJ SOLN
INTRAMUSCULAR | Status: DC | PRN
  Administered 2024-08-08 (×2): 4 mg via INTRAVENOUS

## 2024-08-08 MED ORDER — LIDOCAINE HCL (CARDIAC) PF 100 MG/5ML IV SOSY: PREFILLED_SYRINGE | INTRAVENOUS | Status: DC | PRN

## 2024-08-08 MED ORDER — ONDANSETRON HCL 4 MG/2ML IJ SOLN: 4.0000 mg | Freq: Once | INTRAMUSCULAR | Status: DC | PRN

## 2024-08-08 MED ORDER — POVIDONE-IODINE 5 % OP SOLN
OPHTHALMIC | Status: DC | PRN
  Administered 2024-08-08 (×2): 1 via OPHTHALMIC

## 2024-08-08 MED ORDER — LACTATED RINGERS IV SOLN: INTRAVENOUS | Status: DC

## 2024-08-08 MED ORDER — LIDOCAINE 2% (20 MG/ML) 5 ML SYRINGE
INTRAMUSCULAR | Status: AC
  Filled 2024-08-08: qty 5

## 2024-08-08 MED ORDER — ACETAMINOPHEN-CODEINE 300-30 MG PO TABS: 1.0000 | ORAL_TABLET | ORAL | 0 refills | Status: AC | PRN

## 2024-08-08 MED ORDER — LIDOCAINE 2% (20 MG/ML) 5 ML SYRINGE
INTRAMUSCULAR | Status: DC | PRN
  Administered 2024-08-08 (×2): 100 mg via INTRAVENOUS

## 2024-08-08 MED ORDER — MIDAZOLAM HCL 5 MG/5ML IJ SOLN
INTRAMUSCULAR | Status: DC | PRN
  Administered 2024-08-08 (×2): 2 mg via INTRAVENOUS

## 2024-08-08 MED ORDER — FENTANYL CITRATE (PF) 100 MCG/2ML IJ SOLN
INTRAMUSCULAR | Status: DC | PRN
  Administered 2024-08-08 (×8): 25 ug via INTRAVENOUS

## 2024-08-08 MED ORDER — PHENYLEPHRINE HCL 2.5 % OP SOLN
OPHTHALMIC | Status: DC | PRN
  Administered 2024-08-08 (×2): 3 [drp]

## 2024-08-08 MED ORDER — KETOROLAC TROMETHAMINE 30 MG/ML IJ SOLN
INTRAMUSCULAR | Status: DC | PRN
  Administered 2024-08-08 (×2): 30 mg via INTRAVENOUS

## 2024-08-08 MED ORDER — MEPERIDINE HCL 25 MG/ML IJ SOLN: 6.2500 mg | INTRAMUSCULAR | Status: DC | PRN

## 2024-08-08 MED ORDER — DEXAMETHASONE SODIUM PHOSPHATE 10 MG/ML IJ SOLN
INTRAMUSCULAR | Status: DC | PRN
Start: 2024-08-08 — End: 2024-08-08
  Administered 2024-08-08 (×2): 10 mg via INTRAVENOUS

## 2024-08-08 MED ORDER — OXYCODONE HCL 5 MG/5ML PO SOLN: 5.0000 mg | Freq: Once | ORAL | Status: DC | PRN

## 2024-08-08 MED ORDER — OXYCODONE HCL 5 MG PO TABS: 5.0000 mg | ORAL_TABLET | Freq: Once | ORAL | Status: DC | PRN

## 2024-08-08 MED ORDER — PROPOFOL 10 MG/ML IV BOLUS
INTRAVENOUS | Status: AC
  Filled 2024-08-08: qty 20

## 2024-08-08 MED ORDER — FENTANYL CITRATE (PF) 100 MCG/2ML IJ SOLN: 25.0000 ug | INTRAMUSCULAR | Status: DC | PRN

## 2024-08-08 MED ORDER — TOBRADEX 0.3-0.1 % OP OINT
1.0000 | TOPICAL_OINTMENT | Freq: Two times a day (BID) | OPHTHALMIC | 0 refills | Status: AC
Start: 2024-08-08 — End: ?

## 2024-08-08 MED ORDER — PROPOFOL 10 MG/ML IV BOLUS
INTRAVENOUS | Status: DC | PRN
  Administered 2024-08-08 (×2): 200 mg via INTRAVENOUS

## 2024-08-08 MED ORDER — TOBRAMYCIN 0.3 % OP OINT
TOPICAL_OINTMENT | OPHTHALMIC | Status: DC | PRN
  Administered 2024-08-08 (×2): 1 via OPHTHALMIC

## 2024-08-08 MED ORDER — FENTANYL CITRATE (PF) 100 MCG/2ML IJ SOLN
INTRAMUSCULAR | Status: AC
  Filled 2024-08-08: qty 2

## 2024-08-08 SURGICAL SUPPLY — 19 items
APPLICATOR DR MATTHEWS STRL (MISCELLANEOUS) ×1 IMPLANT
BNDG EYE OVAL 2 1/8 X 2 5/8 (GAUZE/BANDAGES/DRESSINGS) IMPLANT
CAUTERY EYE LOW TEMP OLD (MISCELLANEOUS) IMPLANT
COVER BACK TABLE 60X90IN (DRAPES) ×1 IMPLANT
COVER MAYO STAND STRL (DRAPES) ×1 IMPLANT
DRAPE SURG 17X23 STRL (DRAPES) IMPLANT
GLOVE BIOGEL PI IND STRL 6.5 (GLOVE) IMPLANT
GLOVE SURG PR MICRO ENCORE 7.5 (GLOVE) ×1 IMPLANT
GLOVE SURG SS PI 6.5 STRL IVOR (GLOVE) IMPLANT
GOWN STRL REUS W/ TWL LRG LVL3 (GOWN DISPOSABLE) ×2 IMPLANT
PACK BASIN DAY SURGERY FS (CUSTOM PROCEDURE TRAY) ×1 IMPLANT
SHEET MEDIUM DRAPE 40X70 STRL (DRAPES) ×1 IMPLANT
STRIP CLOSURE SKIN 1/2X4 (GAUZE/BANDAGES/DRESSINGS) ×1 IMPLANT
SUT VICRYL 6 0 S 29 12 (SUTURE) ×1 IMPLANT
SUT VICRYL 7 0 TG140 8 (SUTURE) IMPLANT
SUT VICRYL 8 0 TG140 8 (SUTURE) IMPLANT
SUTURE MERSLN 6-0 18IN S14 8MM (SUTURE) IMPLANT
TOWEL GREEN STERILE FF (TOWEL DISPOSABLE) ×1 IMPLANT
TRAY DSU PREP LF (CUSTOM PROCEDURE TRAY) ×1 IMPLANT

## 2024-08-08 NOTE — Anesthesia Preprocedure Evaluation (Addendum)
 Anesthesia Evaluation  Patient identified by MRN, date of birth, ID band Patient awake    Reviewed: Allergy & Precautions, H&P , NPO status , Patient's Chart, lab work & pertinent test results  Airway Mallampati: I  TM Distance: >3 FB Neck ROM: Full    Dental no notable dental hx. (+) Teeth Intact, Dental Advisory Given FULL BRACES:   Pulmonary neg pulmonary ROS   Pulmonary exam normal breath sounds clear to auscultation       Cardiovascular Exercise Tolerance: Good negative cardio ROS Normal cardiovascular exam+ dysrhythmias  Rhythm:Regular Rate:Normal  Wolff-Parkinson-White  syndrome   Neuro/Psych  PSYCHIATRIC DISORDERS Anxiety  Bipolar Disorder   Cerebral palsy   Neuromuscular disease negative neurological ROS  negative psych ROS   GI/Hepatic negative GI ROS, Neg liver ROS,,,  Endo/Other  negative endocrine ROS    Renal/GU negative Renal ROS  negative genitourinary   Musculoskeletal negative musculoskeletal ROS (+)    Abdominal   Peds negative pediatric ROS (+)  Hematology negative hematology ROS (+)   Anesthesia Other Findings Agitation Bipolar 1 disorder with moderate mania Cerebral palsy  Generalized anxiety disorder Vitamin D  deficiency Wolff-Parkinson-White syndrome  Reproductive/Obstetrics negative OB ROS                              Anesthesia Physical Anesthesia Plan  ASA: 3  Anesthesia Plan: General   Post-op Pain Management: Minimal or no pain anticipated and Precedex    Induction: Intravenous  PONV Risk Score and Plan: Midazolam , Ondansetron , Dexamethasone  and Treatment may vary due to age or medical condition  Airway Management Planned: Oral ETT and LMA  Additional Equipment: None  Intra-op Plan:   Post-operative Plan: Extubation in OR  Informed Consent: I have reviewed the patients History and Physical, chart, labs and discussed the procedure including  the risks, benefits and alternatives for the proposed anesthesia with the patient or authorized representative who has indicated his/her understanding and acceptance.       Plan Discussed with: Anesthesiologist and CRNA  Anesthesia Plan Comments: (  )         Anesthesia Quick Evaluation

## 2024-08-08 NOTE — Anesthesia Postprocedure Evaluation (Signed)
 Anesthesia Post Note  Patient: Phillip [name]  Procedure(s) Performed: STRABISMUS SURGERY, BILATERAL (Bilateral: Eye)     Patient location during evaluation: PACU Anesthesia Type: General Level of consciousness: awake and alert Pain management: pain level controlled Vital Signs Assessment: post-procedure vital signs reviewed and stable Respiratory status: spontaneous breathing, nonlabored ventilation, respiratory function stable and patient connected to nasal cannula oxygen Cardiovascular status: blood pressure returned to baseline and stable Postop Assessment: no apparent nausea or vomiting Anesthetic complications: no   No notable events documented.  Last Vitals:  Vitals:   08/08/24 1430 08/08/24 1449  BP: 128/68 126/74  Pulse: 99 83  Resp: 13 16  Temp:  36.7 C  SpO2: 98% 98%    Last Pain:  Vitals:   08/08/24 1449  TempSrc: Temporal  PainSc: 0-No pain                 Amberlee Garvey

## 2024-08-08 NOTE — Interval H&P Note (Signed)
 History and Physical Interval Note:  08/08/2024 11:16 AM  Phillip [name]  has presented today for surgery, with the diagnosis of CONGENITAL NYSTAGMUS.  The various methods of treatment have been discussed with the patient and family. After consideration of risks, benefits and other options for treatment, the patient has consented to  Procedure(s): STRABISMUS SURGERY, BILATERAL (Bilateral) as a surgical intervention.  [name] history has been reviewed, patient examined, no change in status, stable for surgery.  I have reviewed the patient's chart and labs.  Questions were answered to the patient's satisfaction.     Ozell Mirza

## 2024-08-08 NOTE — Discharge Instructions (Addendum)
 Advance  to PO as tolerated . Cool compresses ou Q 5 ' as tolerated D/C IVF when PO intake adequate. DC to home post VSS. F/U @ [facility] x 1 wk.

## 2024-08-08 NOTE — Transfer of Care (Signed)
 Immediate Anesthesia Transfer of Care Note  Patient: Phillip [name]  Procedure(s) Performed: STRABISMUS SURGERY, BILATERAL (Bilateral: Eye)  Patient Location: PACU  Anesthesia Type:General  Level of Consciousness: sedated  Airway & Oxygen Therapy: Patient Spontanous Breathing and Patient connected to face mask oxygen  Post-op Assessment: Report given to RN and Post -op Vital signs reviewed and stable  Post vital signs: Reviewed and stable  Last Vitals:  Vitals Value Taken Time  BP 115/61 08/08/24 13:40  Temp    Pulse 77 08/08/24 13:42  Resp 15 08/08/24 13:42  SpO2 98 % 08/08/24 13:42  Vitals shown include unfiled device data.  Last Pain:  Vitals:   08/08/24 1007  TempSrc: Temporal  PainSc: 0-No pain         Complications: No notable events documented.

## 2024-08-08 NOTE — Anesthesia Procedure Notes (Signed)
 Procedure Name: LMA Insertion Date/Time: 08/08/2024 11:48 AM  Performed by: Julieanne Fairy BROCKS, CRNAPre-anesthesia Checklist: Patient identified, Emergency Drugs available, Suction available and Patient being monitored Patient Re-evaluated:Patient Re-evaluated prior to induction Oxygen Delivery Method: Circle system utilized Preoxygenation: Pre-oxygenation with 100% oxygen Induction Type: IV induction Ventilation: Mask ventilation without difficulty LMA: LMA flexible inserted LMA Size: 5.0 Number of attempts: 1 Airway Equipment and Method: Bite block Placement Confirmation: positive ETCO2 Tube secured with: Tape Dental Injury: Teeth and Oropharynx as per pre-operative assessment

## 2024-08-08 NOTE — Brief Op Note (Signed)
 08/08/2024  1:41 PM  PATIENT:  Phillip [name]  23 y.o. male  PRE-OPERATIVE DIAGNOSIS:  CONGENITAL NYSTAGMUS  POST-OPERATIVE DIAGNOSIS:  CONGENITAL NYSTAGMUS  PROCEDURE:  Procedure(s): STRABISMUS SURGERY, BILATERAL (Bilateral)  SURGEON:  Surgeons and Role:    DEWAINE Jacques Sharper, MD - Primary  PHYSICIAN ASSISTANT:   ASSISTANTS: none   ANESTHESIA:   general  EBL:  10 mL   BLOOD ADMINISTERED:none  DRAINS: none   LOCAL MEDICATIONS USED:  NONE  SPECIMEN:  No Specimen  DISPOSITION OF SPECIMEN:  N/A  COUNTS:  YES  TOURNIQUET:  * No tourniquets in log *  DICTATION: .Other Dictation: Dictation Number [id number]  PLAN OF CARE: Discharge to home after PACU  PATIENT DISPOSITION:  PACU - hemodynamically stable.   Delay start of Pharmacological VTE agent (>24hrs) due to surgical blood loss or risk of bleeding: no

## 2024-08-09 ENCOUNTER — Encounter (HOSPITAL_BASED_OUTPATIENT_CLINIC_OR_DEPARTMENT_OTHER): Payer: Self-pay | Admitting: Ophthalmology

## 2024-08-09 NOTE — Op Note (Signed)
 Phillip [name], Phillip [name] MEDICAL RECORD NO: [id number] ACCOUNT NO: [id number] DATE OF BIRTH: [birth year] FACILITY: [facility] LOCATION: [facility] PHYSICIAN: Ozell LABOR. Jacques, MD  Operative Report   DATE OF PROCEDURE: 08/08/2024  PREOPERATIVE DIAGNOSIS:  Congenital horizontal jerk nystagmus.  POSTOPERATIVE DIAGNOSES:  Status post extraocular muscle surgery for nystagmus.  PROCEDURE: Kestenbaum procedure which is a left medial rectus recession of 5 mm, left lateral rectus resection of 8 mm via plication, right medial rectus resection of 6 mm, and right lateral rectus recession of 7 mm.  SURGEON: Ozell LABOR. Jacques, MD.  ANESTHESIA: General with laryngeal mask.  INDICATIONS FOR PROCEDURE:  The patient is a 23 year old Muslim male with congenital nystagmus with a left face turn for a null point in right gaze.  This procedure is indicated to translocate the null point to the central straight position, and minimize nystagmus in the primary position, and improve visual acuity.  Risks and benefits of the procedure were explained to the patient's family and the patient prior to the procedure. Informed consent was obtained.  DESCRIPTION OF PROCEDURE:  The patient was taken to the operating room and placed in the supine position. The entire face was prepped and draped in the usual sterile fashion.  My attention was first directed to the left eye.  The globe was held at the  inferior nasal limbus.  The eye was depressed and abducted.  An incision was made through the inferior nasal fornix, taken down to the posterior subtenon space, and the left medial rectus tendon was then isolated on a Stevens hook, subsequently on a green hook.  It was carefully dissected free from its overlying muscle fascia and intermuscular septum were transected.  The tendon was then imbricated on 6-0 Vicryl suture taking 2 locking bites to the medial and temporal apices.  It was then recessed  to 5.0 mm from its native insertion,  reattached to the globe at the recessed position.  The sutures tied securely and the conjunctiva repositioned.  My attention was then directed to the contralateral right eye. A lid speculum was placed. Forced duction tests were performed and found to be negative.  The eye globe was held at the inferotemporal limbus.  The eye was elevated and adducted and the  tendon was then carefully dissected free from its overlying muscle fascia.  The intermuscular septae were transected. The tendon was then imbricated on 6-0 Vicryl suture. The tendon was then detached from the globe and recessed to 7.0 mm from its native insertion, reattached to the globe in a recessed position.  The conjunctiva was then repositioned. My attention was then directed to the right medial rectus tendon. The globe was held at the inferomedial limbus.  The eye was elevated and abducted.  An  incision was made through the inferonasal fornix, taken down to the posterior subtenon space, and the right medial rectus tendon was then carefully dissected free from its overlying muscle fascia and the intermuscular septum transected for a distance of approximately 8 mm.  A mark was then placed on the tendon at 5.0 mm from its native insertion.  It was then imbricated on 6-0 Vicryl suture taking 2 locking bites at the medial and temporal apices at the pre-placed mark.  The tendon was then advanced to  its native insertion site on the preplaced sutures via plication and reattached 1.0 mm anterior to its insertion, completing a 6-mm resection. The sutures were then tied securely and the conjunctiva was repositioned.  My attention was then redirected to  the left eye.  The globe was held at the inferotemporal limbus.  The eye was adducted and elevated.  An incision was made through the inferior temporal fornix taken down to the posterior subtenon space, and the tendon was then carefully dissected free from its overlying muscle fascia and intermuscular septum  for a distance of approximately 7 mm.  A mark was then placed on the tendon at the 7-mm position, and the tendon was then imbricated on 6-0 Vicryl suture taking 2 locking bites in the medial and temporal apices at this position.  The tendon was then advanced to its native insertion site with the preplaced sutures via plication and reattached at 1.37mm anterior to its insertion, completing an 8-mm resection.  The Sutures were tied securely and the  Conjunctiva was repositioned.  At the conclusion of the procedure, antibiotic ointment was instilled in the inferior fornices of both eyes. There were no apparent complications.   NIK D: 08/08/2024 1:49:55 pm T: 08/09/2024 4:24:00 am  JOB: [id number]/ [id number]

## 2024-08-30 NOTE — Progress Notes (Unsigned)
 BH MD Outpatient Progress Note  08/31/2024 9:55 AM Phillip [name]  MRN:  [id number]  Assessment:  Phillip [name] presents for follow-up evaluation. Today, 08/31/24, patient endorses continued stability of mood without any episodes of behavioral dysregulation. Mom confirms this as well. They report that he has experienced perhaps mild improvement in daytime fatigue with reduction in Zyprexa  although still persistent; they note low energy may be related to ongoing recovery from eye surgery and subsequent interruption to engagement in day program. Patient presents as more engaged in interview today with improved reactivity of affect and spontaneity of speech. Given tolerability thus far, amenable to further taper of Zyprexa  as below. He has tolerated discontinuation of propranolol  well and note reassuring results from echo and stress test. No other changes to plan of care at this time.  RTC in approx. 2 months by video.  Patient was made aware of this provider's departure from [facility] at the end of Nov 2025 and that he will be transitioned to alternative provider in the clinic after this time. All questions/concerns addressed.  Identifying Information: Phillip [name] is a 23 y.o. 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 Patient carries historical diagnosis of bipolar 1 disorder - per report from family patient experiences discrete mood episodes fluctuating between active states in which patient experiences marked mood lability, decreased need for sleep, impulsivity, psychomotor agitation, aggression towards self and others, and AH (although patient describes this as negative internal dialogue) and inactive states in which he sleeps excessively and appears oversedated. Most recently, he has been in an inactive  state. Patient denies current symptoms concerning for depression, anxiety, or irritability and primarily notes significant daytime fatigue. He has not had any recent episodes of aggression or psychosis. It is likely that current medication regimen is contributing to fatigue and will work to consolidate medications (in particular reduce Zyprexa ) as tolerated to minimize daytime sedation. Additionally, discussed importance of consistent sleep schedule and proper sleep hygiene with both patient and family as he demonstrates delayed sleep phase.   Plan:  # Historical diagnosis of bipolar 1 disorder Past medication trials: Invega  PO, Seroquel , Abilify  PO and Maintena, amantadine , trazodone , Ambien  Status of problem: stable Interventions: -- Continue Depakote  ER 500 mg qAM + 1000 mg at bedtime  -- If patient's behaviors recur, will pursue further titration of Depakote  as tolerated -- DECREASE Zyprexa  to 10 mg nightly to reduce daytime fatigue (d6/24/25, d9/5/25)  # Delayed sleep phase Past medication trials: melatonin (ineffective), trazodone , Ambien  Status of problem: acute Interventions: -- Encouraged consistent sleep schedule -- Counseled against use of electronics prior to bedtime; partnered with patient and parents to set cutoff time of 11PM -- Can reconsider melatonin for circadian rhythm regulation; brother has declined given inefficacy in the past  # Cerebral palsy with developmental delay Status of problem: chronic Interventions: -- Followed by [facility] neurology with baseline left-sided weakness, nystagmus -- Continue day programming M-Thurs -- MRI brain 09/09/22 with no acute intracranial abnormality; demonstrated periventricular leukomalacia  # Vitamin D  deficiency Status of problem: acute on chronic Interventions: -- Vit D 01/23/24 16.9: continue Vitamin D3 2000 IU daily (s6/24/25) -- Reviewed natural ways of supplementing Vitamin D  including exposure to sunlight and incorporating  foods high yield for Vitamin D   # WPW syndrome -- Established with cardiology: most recent EKG 06/27/24 showing WPW pattern. Echo  07/05/24: EF 55-60%, structurally normal heart. Stress test and zio monitoring performed and deemed to be low risk studies. -- Patient previously managed on propranolol  however discontinued 07/03/24 per cardiology recommendation  -- Per cards: Consider alternative medication for propanolol in the setting of WPW as this medication slows the natural conduction through the AV node which can facilitate preferential conduction through the accessory pathway thus increasing the risk of ventricular fibrillation and sudden cardiac death.  -- Will AVOID any medications with high risk for Qtc prolongation or arrhythmia; on literature review, olanzapine  is felt to pose lower risk for Qtc prolongation and WPW pattern precedes use of olanzapine   # Medication monitoring Interventions: -- Zyprexa   -- Lipid profile revealing for elevated LDL (01/23/24)  -- HgbA1c wnl (01/23/24)  -- EKG 04/01/23: NSR with WPW pattern; persistent on most recent EKG 07/05/24 - established with cardiology -- Depakote :  -- CMP wnl 03/31/23  -- CBC wnl 03/31/23  -- Depakote  level 50 01/23/24 (true trough is likely lower than this level given time of draw and ER formulation)    Patient was given contact information for behavioral health clinic and was instructed to call 911 for emergencies.   Subjective:  Chief Complaint:  Chief Complaint  Patient presents with   Medication Management    Interval History:   Patient provides permission for his mother to be present for visit. Arabic video interpreter utilized.   Phillip [name] reports he is currently recovering from a cold. This Clinical research associate notes his birthday today and he laughs, expressing thanks. Had eye surgery and this went well; notes vision has been improving. Reports mood has been okay - some moments in which he may feel down prompted by thinking about the past but  this is brief. Denies persistent signs/sx of depression. Has taken a break from day program due to surgery but plans to go back soon. Practicing English online so he feels more prepared when he returns. Reports his energy/motivation during the day has been a bit low related to recovering from surgery however has been walking a bit more from prior. Trying to prioritize more physical activity.  Sleeping better at night - no longer using phone before bedtime outside of listening to Quran. Getting about 6-7 hours nightly. Some napping during the day.  Denies SI/HI, AVH.  He denies awareness of significant difference with Zyprexa  reduction. Continuing to sleep well at night but still feels somewhat fatigued during the day - although notes this may be related to not being in school right now and not having a routine.  Mother joins the visit and reports Phillip [name] has been doing well after getting through initial recovery period from eye surgery. She feels he did well with decrease in Zyprexa ; perhaps some mild improvement in daytime fatigue. However, still remains sluggish and they try hard to engage him throughout the day. Believe the medications are likely still contributing to some daytime fatigue. No concerning behaviors or agitation.   She confirms he has stopped propranolol . Taking other medications as prescribed. No further workup per cardiology; will continue to monitor for time being.   Phillip [name] and mother amenable to further reduction in Zyprexa  to 10 mg nightly given tolerability thus far and ongoing daytime fatigue.   Visit Diagnosis:    ICD-10-CM   1. Bipolar 1 disorder (HCC)  F31.9 divalproex  (DEPAKOTE  ER) 500 MG 24 hr tablet    OLANZapine  (ZYPREXA ) 10 MG tablet    2. Generalized anxiety disorder  F41.1     3. Cerebral  palsy, unspecified type (HCC)  G80.9       Past Psychiatric History:  Diagnoses: Historical diagnosis of bipolar 1 disorder; developmental delay secondary to cerebral  palsy Medication trials: Invega  PO, Seroquel , Abilify  PO and Maintena, amantadine , trazodone  Hospitalizations: denies Suicide attempts: denies SIB: May bang head against wall during periods of agitation Hx of violence towards others: Has experienced aggression towards family and punching of walls during periods of agitation Substance use: denies use of etoh, illicit drugs, cannabis/CBD/THC, tobacco use Developmental: Full-term; hypoxia at delivery requiring NICU care and resulting in developmental delays and cerebral palsy  Past Medical History:  Past Medical History:  Diagnosis Date   Agitation    Bipolar 1 disorder (HCC)    Cerebral palsy (HCC)    Wolff-Parkinson-White (WPW) syndrome     Past Surgical History:  Procedure Laterality Date   STRABISMUS SURGERY Bilateral 08/08/2024   Procedure: STRABISMUS SURGERY, BILATERAL;  Surgeon: Jacques Sharper, MD;  Location: [facility];  Service: Ophthalmology;  Laterality: Bilateral;   TONSILLECTOMY      Family Psychiatric History:  Brother: depression  Family History:  Family History  Problem Relation Age of Onset   Depression Brother     Social History:  Academic/Vocational: Attended special needs high school in [place]  Social History   Socioeconomic History   Marital status: Single    Spouse name: Not on file   Number of children: Not on file   Years of education: Not on file   Highest education level: Not on file  Occupational History   Not on file  Tobacco Use   Smoking status: Never   Smokeless tobacco: Never  Vaping Use   Vaping status: Never Used  Substance and Sexual Activity   Alcohol use: Never   Drug use: Never   Sexual activity: Not on file  Other Topics Concern   Not on file  Social History Narrative   Not on file   Social Drivers of Health   Financial Resource Strain: Not on file  Food Insecurity: Low Risk  (06/27/2024)   Received from [facility]   Hunger Vital Sign    Within the  past 12 months, you worried that your food would run out before you got money to buy more: Never true    Within the past 12 months, the food you bought just didn't last and you didn't have money to get more. : Never true  Transportation Needs: No Transportation Needs (06/27/2024)   Received from [facility]    In the past 12 months, has lack of reliable transportation kept you from medical appointments, meetings, work or from getting things needed for daily living? : No  Physical Activity: Not on file  Stress: Not on file  Social Connections: Unknown (03/28/2023)   Received from [facility]   Social Network    Social Network: Not on file    Allergies: No Known Allergies  Current Medications: Current Outpatient Medications  Medication Sig Dispense Refill   Cholecalciferol (VITAMIN D3) 50 MCG (2000 UT) CAPS Take 2,000 Units by mouth daily.     divalproex  (DEPAKOTE  ER) 500 MG 24 hr tablet Take 1 tablet (500 mg total) by mouth in the morning AND 2 tablets (1,000 mg total) at bedtime. 90 tablet 2   OLANZapine  (ZYPREXA ) 10 MG tablet Take 1 tablet (10 mg total) by mouth at bedtime. 30 tablet 2   tobramycin -dexamethasone  (TOBRADEX ) ophthalmic ointment Place 1 Application into the left eye 2 (two)  times daily at 10 am and 4 pm. 3.5 g 0   No current facility-administered medications for this visit.    ROS: Reports daytime fatigue  Objective:  Psychiatric Specialty Exam: There were no vitals taken for this visit.There is no height or weight on file to calculate BMI.  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); wearing glasses  Speech:  Normal Rate and requires Arabic interpreter; improved spontaneity of speech  Volume:  Normal  Mood:  good  Affect:  Euthymic, calm, pleasant  Thought Content: Denies AVH; no overt delusional thought content on interview     Suicidal Thoughts:  No  Homicidal Thoughts:  No  Thought Process: Brief however overall linear/logical  Orientation:  Full (Time, Place, and Person)    Memory: Grossly intact  Judgment:  Fair  Insight:  Fair  Concentration:  Concentration: Good  Recall: not formally assessed   Fund of Knowledge: Fair  Language: Good  Psychomotor Activity:  Normal  Akathisia:  No  AIMS (if indicated): not done  Assets:  Communication Skills Desire for Improvement Housing Leisure Time Social Support Transportation Vocational/Educational  ADL's:  Intact; requires assistance with iADLs  Cognition: Concern for mild impairment  Sleep:  Fair   PE: General: sits comfortably in view of camera; no acute distress  Pulm: no increased work of breathing on room air  MSK: all extremity movements appear intact  Neuro: Horizontal nystagmus  Gait & Station: unable to assess by video   Metabolic Disorder Labs: Lab Results  Component Value Date   HGBA1C 4.8 01/23/2024   No results found for: PROLACTIN Lab Results  Component Value Date   CHOL 179 01/23/2024   TRIG 109 01/23/2024   HDL 48 01/23/2024   CHOLHDL 3.7 01/23/2024   LDLCALC 111 (H) 01/23/2024   No results found for: TSH  Therapeutic Level Labs: No results found for: LITHIUM Lab Results  Component Value Date   VALPROATE 50 01/23/2024   VALPROATE 15 (L) 04/01/2023   No results found for: CBMZ  Screenings:  GAD-7    Flowsheet Row Office Visit from 04/04/2023 in [facility]  Total GAD-7 Score 11   PHQ2-9    Flowsheet Row Office Visit from 04/04/2023 in [facility]  PHQ-2 Total Score 1  PHQ-9 Total Score 13   Flowsheet Row Admission (Discharged) from 08/08/2024 in [facility] ED from 05/19/2023 in [facility] ED from 03/31/2023 in [facility] Emergency Department at [facility]  C-SSRS RISK CATEGORY No Risk No Risk No Risk     Collaboration of Care: Collaboration of Care: Medication Management AEB active medication management and Psychiatrist AEB established with this provider  Patient/Guardian was advised Release of Information must be obtained prior to any record release in order to collaborate their care with an outside provider. Patient/Guardian was advised if they have not already done so to contact the registration department to sign all necessary forms in order for us  to release information regarding their care.   Consent: Patient/Guardian gives verbal consent for treatment and assignment of benefits for services provided during this visit. Patient/Guardian expressed understanding and agreed to proceed.   A total of 60 minutes was spent involved in face to face clinical care, chart review, documentation, brief motivational interviewing, medication management, use of interpreter services, collateral from mother   [facility] 08/31/2024, 9:55  AM

## 2024-08-31 ENCOUNTER — Telehealth (HOSPITAL_COMMUNITY): Payer: MEDICAID | Admitting: Psychiatry

## 2024-08-31 ENCOUNTER — Encounter (HOSPITAL_COMMUNITY): Payer: Self-pay | Admitting: Psychiatry

## 2024-08-31 DIAGNOSIS — F319 Bipolar disorder, unspecified: Secondary | ICD-10-CM

## 2024-08-31 DIAGNOSIS — F411 Generalized anxiety disorder: Secondary | ICD-10-CM | POA: Diagnosis not present

## 2024-08-31 DIAGNOSIS — I456 Pre-excitation syndrome: Secondary | ICD-10-CM

## 2024-08-31 DIAGNOSIS — E559 Vitamin D deficiency, unspecified: Secondary | ICD-10-CM

## 2024-08-31 DIAGNOSIS — G809 Cerebral palsy, unspecified: Secondary | ICD-10-CM

## 2024-08-31 MED ORDER — OLANZAPINE 10 MG PO TABS: 10.0000 mg | ORAL_TABLET | Freq: Every day | ORAL | 2 refills | Status: DC

## 2024-08-31 MED ORDER — DIVALPROEX SODIUM ER 500 MG PO TB24: ORAL_TABLET | ORAL | 2 refills | Status: DC

## 2024-08-31 NOTE — Patient Instructions (Signed)
 Thank you for attending your appointment today.  -- DECREASE olanzapine  to 10 mg nightly -- Continue other medications as prescribed.  Please do not make any changes to medications without first discussing with your provider. If you are experiencing a psychiatric emergency, please call 911 or present to your nearest emergency department. Additional crisis, medication management, and therapy resources are included below.  [facility]  [address], [place], [place] [address] [phone] WALK-IN URGENT CARE 24/7 FOR ANYONE [address] [place], [place]  [phone] Fax: [phone] [email or web address] *Interpreters available *Accepts all insurance and uninsured for Urgent Care needs *Accepts Medicaid and uninsured for outpatient treatment (below)      ONLY FOR [facility]  Below:    Outpatient New Patient Assessment/Therapy Walk-ins:        Monday, Wednesday, and Thursday 8am until slots are full (first come, first served)                   New Patient Psychiatry/Medication Management        Monday-Friday 8am-11am (first come, first served)               For all walk-ins we ask that you arrive by 7:15am, because patients will be seen in the order of arrival.

## 2024-10-10 NOTE — Progress Notes (Signed)
 [facility] - PRIMARY CARE ADULT [facility] INTERNAL MEDICINE URGENT CARE PATIENT VISIT Encounter Provider: [facility] Adult IM Same Day PCP: Lillia Fitzpatrick, MD Preceptor: Preceptor: Dr. Roark CC: Wound Check (C/o wound near anus not getting better. Pt reports some  itching and foul  smell when reopened.) and Forms/questionnaires   Assessment & Plan:  Phillip [name] with PMHx of Wolff-Parkinson-White Syndrome, GERD, cerebral palsy, and bipolar disorder is a 23 y.o. male who presents for an urgent care patient visit today for a wound check of the anus.  # Cellulitis of anus -Initially seen in August 2025 for pruritic wound of the anus found to be consistent with anal cellulitis and was prescribed topical bacitracin for 7 days as well as a 5-day course of Bactrim - Has concerns of wound near the anus which is not healing, states that initially improved but opened again and another area, states that it is draining fluid and becomes itchy and burns - Using bacitracin ointment daily and finished course of Bactrim prescribed at visit in August 2025 - On physical exam, small amount of erythema and scab seen near the anus but no abscess or drainage present --  PLAN: - Counseled that the infection or wound of the anus appears to be healing appropriately - Continue to keep the area clean by washing with soap and water daily as well as continuing to apply nonbacterial ointments to the area including Vaseline and Aquaphor - Refilled bacitracin for total course of 7 days - Advised to make a return appointment to the clinic if the wound worsens or patient experiences fevers or chills; advised that if the wound becomes worse we can consider referring patient to surgery for further evaluation  # Documentation requested - Requested document community alternative program for disabled adults waiver disclosure form be completed - Place in primary care provider, Dr. Armon, folder  and messaged him regarding document -- PLAN: - Advised patient and family that we contacted PCP to fill out documentation  Orders: No orders of the defined types were placed in this encounter.  History of Present Illness:  Phillip [name] is a 23 y.o. male with PMHx of WPW, GERD, cerebral palsy, and bipolar disorder who presents for an urgent care patient visit today for wound check of the anus.  Patient was seen on 8/21 for pruritic wound of anus found to be consistent with anal cellulitis. Patient was prescribed topical bacitracin for 7 days as well as a 5-day course of Bactrim.  Advised to take Bactrim if the symptoms did not improve with topical treatment.  Patient's father accompanies him today. Reports that the patient has a wound on his backside which has become infected and is not healing. States that the wound initially improved but then opened again any other place. States that when the wound opens it drains fluid and becomes itchy and starts to burn. Patient finished his course of Bactrim which was prescribed in August 2025. Patient continues to use the bacitracin ointment daily but states that it is not helping. Patient reports that when he has constipation and strains the wound opens again. Denies any systemic symptoms such as fevers or chills.  Patient's family also desires a form known as community alternative program for disabled adults waiver disclosure form to be completed.  They would like to be called after the form was completed so they can pick it up.  States that it is due on Monday, October 20.  Objective:  Physical Exam: BP 137/77  Pulse 70   Temp 98.5 F (36.9 C) (Oral)   Ht 1.702 m (5' 7)   Wt 82.9 kg (182 lb 11.2 oz)   SpO2 100%   BMI 28.61 kg/m  CONSTITUTIONAL: well developed, nourished, no distress, alert and oriented x3 CARDIOVASCULAR: heart sounds normal, normal rate, and regular rhythm PULMONARY/CHEST WALL: breath sounds normal and effort normal; lungs  clear to auscultation RECTAL: Examined patient with senior resident and nurse present, small area of erythema with scab present on anus but no abscess or drainage observed SKIN: warm, dry, intact PSYCH: affect normal and mood normal   Current Outpatient Medications  Medication Instructions  . bacitracin 500 unit/gram ointment topical, 2 times daily  . divalproex  (DEPAKOTE ) 500 mg  . OLANZapine  (ZyPREXA ) 10 mg tablet 1 tablet, At bedtime  . OLANZapine  (ZYPREXA ) 5 mg, Daily   Electronically signed by: Roxana Matsu, MD 10/10/2024 7:06 PM
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
## 2024-10-10 NOTE — Progress Notes (Signed)
 Buttocks examination chaperoned by Mingo Lesches, CMA.

## 2024-10-10 NOTE — Progress Notes (Signed)
 [facility] same day upper level plan of care  23 year old male with history of developmental delay, bipolar 1, Wolff-Parkinson-White syndrome, apertognathia, GERD who presents for follow-up of a perianal abscess.  He was prescribed topical bacitracin and systemic Bactrim on 8/21.  Since then, he has been doing well.  The patient presents with his father who has concerns that the infection is recurring.  The patient notes some continued pain and itching of the area.  On examination, there is a small area of erythema with a small scab in the gluteal cleft above the anus.  There is no purulent drainage, fluctuance, palpable masses.  Overall, the area appears reassuring that it is healing appropriately.  Advised the patient and his father to continue bacitracin ointment for another week while the wound completely heals.  After 1 week, recommend cessation of bacitracin and clean the area normally.  Provided reassurance that this issue is likely an isolated incident.  However, if he has recurrent symptoms, we would likely need to refer the patient for further evaluation.  He may need a surgical evaluation for possible fistula in the future.  Electronically signed by: Jackquline Girard Kitty, MD 10/10/2024 5:08 PM

## 2024-10-11 NOTE — Progress Notes (Signed)
 I have discussed the patient with the resident at the time of the visit and agree with the plan of care.   Electronically signed by: Inocente Gottron, MD 10/11/2024 1:46 PM

## 2024-10-12 NOTE — Telephone Encounter (Signed)
 Copied from CRM #[id number]. Topic: Medical Rec/Forms - Medical Rec/Forms >> Oct 12, 2024  1:04 PM Joen B wrote: Shadow, Schedler is calling other request    Include all details related to the request(s) below: Patient is checking on the status of a form he dropped off Monday  with the nurse    Call back @ [phone]    Confirm and type the Best Contact Number below:  Patient/caller contact number:             [] Home  [] Mobile  [] Work [] Other   [] Okay to leave a voicemail   Medication List:  Current Outpatient Medications:  .  bacitracin 500 unit/gram ointment, Apply topically 2 (two) times a day for 10 days., Disp: 15 g, Rfl: 0 .  divalproex  (DEPAKOTE ) 500 mg extended release tablet, Take 500 mg by mouth Indications: bipolar disorder in remission. 1 in the morning and 2 pills in the evening, Disp: , Rfl:  .  OLANZapine  (ZyPREXA ) 10 mg tablet, Take 1 tablet by mouth nightly., Disp: , Rfl:  .  OLANZapine  (ZyPREXA ) 5 mg tablet, Take 5 mg by mouth daily., Disp: , Rfl:      Medication Request/Refills: Pharmacy Information (if applicable)   [] Not Applicable       []  Pharmacy listed  Send Medication Request to:                                                 [] Pharmacy not listed (added to pharmacy list in Epic) Send Medication Request to:      Listed Pharmacies: [id number] - [place], [place] - [address] AT [facility] OF [address] - PHONE: [phone] - FAX: [phone] [id number] [place], [place] - [address] AT [facility] [address] - PHONE: [phone] - FAX: [phone]

## 2024-10-17 NOTE — Telephone Encounter (Signed)
 Patient is calling to follow up on the form that was dropped off on 10/10/24. Patient was advised 5 business day SLA.  Patient would like a return call from the clinical staff as soon as possible.   Call Back number: [phone] [facility] to leave message on call back number

## 2024-10-17 NOTE — Telephone Encounter (Signed)
 Forms would be completed by PCP if properly turned in to medical records.  Rollene LITTIE Seip, CMA

## 2024-10-22 NOTE — Telephone Encounter (Signed)
 Form was completed/faxed and picked up

## 2024-10-24 NOTE — Progress Notes (Unsigned)
 [facility] MD Outpatient Progress Note  10/25/2024 3:00 PM Phillip [name]  MRN:  [id number]  Assessment:  Phillip [name] presents for follow-up evaluation. Today, 10/25/24, patient endorses benefit from reduction in Zyprexa  for daytime fatigue although still present. He started back his day program earlier this month and is enjoying activities although reports occasionally nodding off in school. He reports more consistent sleep schedule at night although may sleep later on the weekends; behavioral recommendations for promoting sleep reviewed today. Phillip [name] and family report continued stability of mood without any episodes of behavioral dysregulation. Patient presents as engaged and polite with bright affect during visit today. Shared decision making utilized to pursue further reduction in nighttime Zyprexa  given impact that persistent fatigue is having on current functionality and engagement at day program. Reviewed signs/sx of emergent mood instability and counseled to reach out with any concerns on lower dose.   Patient was made aware of this provider's departure from [facility] at the end of Nov 2025 and that he will be transitioned to alternative provider in the clinic after this time. All questions/concerns addressed.  RTC in 6 weeks in person with next provider.  Identifying Information: Phillip [name] is a 23 y.o. 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 Patient carries historical diagnosis of bipolar 1 disorder - per report from family patient experiences discrete mood episodes fluctuating between active states in which patient experiences marked mood lability, decreased need for sleep, impulsivity, psychomotor agitation, aggression towards self and others, and AH (although patient describes this as negative internal  dialogue) and inactive states in which he sleeps excessively and appears oversedated. Most recently, he has been in an inactive state. Patient denies current symptoms concerning for depression, anxiety, or irritability and primarily notes significant daytime fatigue. He has not had any recent episodes of aggression or psychosis. It is likely that current medication regimen is contributing to fatigue and will work to consolidate medications (in particular reduce Zyprexa ) as tolerated to minimize daytime sedation. Additionally, discussed importance of consistent sleep schedule and proper sleep hygiene with both patient and family as he demonstrates delayed sleep phase.   Plan:  # Historical diagnosis of bipolar 1 disorder Past medication trials: Invega  PO, Seroquel , Abilify  PO and Maintena, amantadine , trazodone , Ambien  Status of problem: stable Interventions: -- Continue Depakote  ER 500 mg qAM + 1000 mg at bedtime  -- If patient's behaviors recur, will pursue further titration of Depakote  as tolerated -- DECREASE Zyprexa  to 5 mg nightly to reduce daytime fatigue (d6/24/25, d9/5/25, d10/30/25)  # Delayed sleep phase Past medication trials: melatonin (ineffective), trazodone , Ambien  Status of problem: improving Interventions: -- Encouraged consistent sleep schedule -- Counseled against use of electronics prior to bedtime; partnered with patient and parents to set cutoff time of 11PM -- Can reconsider melatonin for circadian rhythm regulation; brother has declined given inefficacy in the past  # Cerebral palsy with developmental delay Status of problem: chronic Interventions: -- Followed by [facility] neurology with baseline left-sided weakness, nystagmus -- Continue day programming M-Thurs -- MRI brain 09/09/22 with no acute intracranial abnormality; demonstrated periventricular leukomalacia  # Vitamin D  deficiency Status of problem: acute on chronic Interventions: -- Vit D 01/23/24 16.9:  continue Vitamin D3 2000 IU daily (s6/24/25) -- Reviewed natural ways of supplementing Vitamin D  including exposure to sunlight and incorporating foods high yield for Vitamin D   #  WPW syndrome -- Established with cardiology: most recent EKG 06/27/24 showing WPW pattern. Echo 07/05/24: EF 55-60%, structurally normal heart. Stress test and zio monitoring performed and deemed to be low risk studies. -- Patient previously managed on propranolol  however discontinued 07/03/24 per cardiology recommendation  -- Per cards: Consider alternative medication for propanolol in the setting of WPW as this medication slows the natural conduction through the AV node which can facilitate preferential conduction through the accessory pathway thus increasing the risk of ventricular fibrillation and sudden cardiac death.  -- AVOID any medications with high risk for Qtc prolongation or arrhythmia; on literature review, olanzapine  is felt to pose lower risk for Qtc prolongation and WPW pattern in this patient precedes use of olanzapine   # Medication monitoring Interventions: -- Zyprexa   -- Lipid profile revealing for elevated LDL (01/23/24)  -- HgbA1c wnl (01/23/24)  -- EKG 04/01/23: NSR with WPW pattern; persistent on most recent EKG 07/05/24 - established with cardiology -- Depakote :  -- CMP wnl 03/31/23  -- CBC wnl 03/31/23  -- Depakote  level 50 01/23/24 (true trough is likely lower than this level given time of draw and ER formulation)    Patient was given contact information for behavioral health clinic and was instructed to call 911 for emergencies.   Subjective:  Chief Complaint:  Chief Complaint  Patient presents with   Medication Management    Interval History:   Patient provides permission for his mother and father to be present for visit. Arabic video interpreter utilized.   Phillip [name] reports he is doing good and denies any major changes/updates since last visit. Reports he tolerated reduction in Zyprexa  well  and feels his energy had been much better. However, about 2 days ago he began experiencing daytime fatigue again and unsure why. Sleeping from 10PM-7AM although on the weekends may sleep until 12PM. Reports reduction in naps although 2 days ago nodded off at school.  Returned to school in October and has been enjoying this. Has been more active, playing sports and going to the mall. Appetite has been stable.   Reports mood has been good and denies feeling persistently low, anxious, or irritable. Denies AVH, paranoia, SI/HI.   Mom reports Phillip [name] has been better although still has some daytime drowsiness. Can be hard to wake him up in the mornings for school despite getting better sleep. Endorses continued mood stability with Zyprexa  reduction. She mentions noticing mild tremor in his hands now that he is off propranolol . Phillip [name] denies that this is bothersome or leading to any dysfunction (denies problems when eating, drinking, playing sports).   Reviewed keeping consistent sleep schedule from weekdays to weekends; if sleeping in to limit to 1-2 hours later from typical schedule.   Introduced option to leave regimen as is and focus on consistent schedule vs. pursue further reduction in Zyprexa . Shared decision making utilized to pursue further reduction in nighttime Zyprexa  given impact ongoing fatigue is having on current functionality and engagement at day program. Reviewed signs/sx of emergent mood instability and counseled to reach out with any concerns on lower dose.   Reviewed transition plan with mother and Phillip [name] given this writer's departure. They expressed understanding.    Visit Diagnosis:    ICD-10-CM   1. Bipolar 1 disorder (HCC)  F31.9 OLANZapine  (ZYPREXA ) 5 MG tablet    divalproex  (DEPAKOTE  ER) 500 MG 24 hr tablet    2. Cerebral palsy, unspecified type (HCC)  G80.9      Past Psychiatric History:  Diagnoses: Historical diagnosis  of bipolar 1 disorder; developmental delay secondary to  cerebral palsy Medication trials: Invega  PO, Seroquel , Abilify  PO and Maintena, amantadine , trazodone  Hospitalizations: denies Suicide attempts: denies SIB: May bang head against wall during periods of agitation Hx of violence towards others: Has experienced aggression towards family and punching of walls during periods of agitation Substance use: denies use of etoh, illicit drugs, cannabis/CBD/THC, tobacco use Developmental: Full-term; hypoxia at delivery requiring NICU care and resulting in developmental delays and cerebral palsy  Past Medical History:  Past Medical History:  Diagnosis Date   Agitation    Bipolar 1 disorder (HCC)    Cerebral palsy (HCC)    Wolff-Parkinson-White (WPW) syndrome     Past Surgical History:  Procedure Laterality Date   STRABISMUS SURGERY Bilateral 08/08/2024   Procedure: STRABISMUS SURGERY, BILATERAL;  Surgeon: Jacques Sharper, MD;  Location: [facility];  Service: Ophthalmology;  Laterality: Bilateral;   TONSILLECTOMY      Family Psychiatric History:  Brother: depression  Family History:  Family History  Problem Relation Age of Onset   Depression Brother     Social History:  Academic/Vocational: Attended special needs high school in [place]  Social History   Socioeconomic History   Marital status: Single    Spouse name: Not on file   Number of children: Not on file   Years of education: Not on file   Highest education level: Not on file  Occupational History   Not on file  Tobacco Use   Smoking status: Never   Smokeless tobacco: Never  Vaping Use   Vaping status: Never Used  Substance and Sexual Activity   Alcohol use: Never   Drug use: Never   Sexual activity: Not on file  Other Topics Concern   Not on file  Social History Narrative   Not on file   Social Drivers of Health   Financial Resource Strain: Not on file  Food Insecurity: Low Risk  (06/27/2024)   Received from [facility]   Hunger Vital Sign     Within the past 12 months, you worried that your food would run out before you got money to buy more: Never true    Within the past 12 months, the food you bought just didn't last and you didn't have money to get more. : Never true  Transportation Needs: No Transportation Needs (06/27/2024)   Received from [facility]    In the past 12 months, has lack of reliable transportation kept you from medical appointments, meetings, work or from getting things needed for daily living? : No  Physical Activity: Not on file  Stress: Not on file  Social Connections: Unknown (03/28/2023)   Received from [facility]   Social Network    Social Network: Not on file    Allergies: No Known Allergies  Current Medications: Current Outpatient Medications  Medication Sig Dispense Refill   Cholecalciferol (VITAMIN D3) 50 MCG (2000 UT) CAPS Take 2,000 Units by mouth daily.     divalproex  (DEPAKOTE  ER) 500 MG 24 hr tablet Take 1 tablet (500 mg total) by mouth in the morning AND 2 tablets (1,000 mg total) at bedtime. 90 tablet 2   OLANZapine  (ZYPREXA ) 5 MG tablet Take 1 tablet (5 mg total) by mouth at bedtime. 30 tablet 2   tobramycin -dexamethasone  (TOBRADEX ) ophthalmic ointment Place 1 Application into the left eye 2 (two) times daily at 10 am and 4 pm. 3.5 g 0   No current facility-administered medications for this  visit.    ROS: See above  Objective:  Psychiatric Specialty Exam: There were no vitals taken for this visit.There is no height or weight on file to calculate BMI.  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); wearing glasses  Speech:  Normal Rate and requires Arabic interpreter; improved spontaneity of speech  Volume:  Normal  Mood:  good  Affect:  Euthymic, calm, pleasant  Thought Content: Denies AVH; no overt delusional thought content on interview    Suicidal Thoughts:  No   Homicidal Thoughts:  No  Thought Process: Brief however overall linear/logical  Orientation:  Full (Time, Place, and Person)    Memory: Grossly intact  Judgment:  Fair  Insight:  Fair  Concentration:  Concentration: Good  Recall: not formally assessed   Fund of Knowledge: Fair  Language: Good  Psychomotor Activity:  Very fine tremor of bilateral hands when outstretched  Akathisia:  No  AIMS (if indicated): not done  Assets:  Communication Skills Desire for Improvement Housing Leisure Time Social Support Transportation Vocational/Educational  ADL's:  Intact; requires assistance with iADLs  Cognition: Concern for mild impairment  Sleep:  improving   PE: General: sits comfortably in view of camera; no acute distress  Pulm: no increased work of breathing on room air  MSK: all extremity movements appear intact  Neuro: Horizontal nystagmus; very fine tremor of bilateral hands when outstretched Gait & Station: unable to assess by video   Metabolic Disorder Labs: Lab Results  Component Value Date   HGBA1C 4.8 01/23/2024   No results found for: PROLACTIN Lab Results  Component Value Date   CHOL 179 01/23/2024   TRIG 109 01/23/2024   HDL 48 01/23/2024   CHOLHDL 3.7 01/23/2024   LDLCALC 111 (H) 01/23/2024   No results found for: TSH  Therapeutic Level Labs: No results found for: LITHIUM Lab Results  Component Value Date   VALPROATE 50 01/23/2024   VALPROATE 15 (L) 04/01/2023   No results found for: CBMZ  Screenings:  GAD-7    Flowsheet Row Office Visit from 04/04/2023 in [facility]  Total GAD-7 Score 11   PHQ2-9    Flowsheet Row Office Visit from 04/04/2023 in [facility]  PHQ-2 Total Score 1  PHQ-9 Total Score 13   Flowsheet Row Admission (Discharged) from 08/08/2024 in [facility] from [facility]/19/2023 in [facility] ED from [facility]31/2023 in [facility] Emergency Department  at [facility]  C-SSRS RISK CATEGORY No Risk No Risk No Risk    Collaboration of Care: Collaboration of Care: Medication Management AEB active medication management and Psychiatrist AEB established with this provider  Patient/Guardian was advised Release of Information must be obtained prior to any record release in order to collaborate their care with an outside provider. Patient/Guardian was advised if they have not already done so to contact the registration department to sign all necessary forms in order for us  to release information regarding their care.   Consent: Patient/Guardian gives verbal consent for treatment and assignment of benefits for services provided during this visit. Patient/Guardian expressed understanding and agreed to proceed.   A total of 60 minutes was spent involved in face to face clinical care, chart review, documentation, brief motivational interviewing, medication management, use of interpreter services, collateral from mother and father.   Delsa Walder A Dina Mobley 10/25/2024, 3:00 PM

## 2024-10-25 ENCOUNTER — Encounter (HOSPITAL_COMMUNITY): Payer: Self-pay | Admitting: Psychiatry

## 2024-10-25 ENCOUNTER — Telehealth (INDEPENDENT_AMBULATORY_CARE_PROVIDER_SITE_OTHER): Payer: MEDICAID | Admitting: Psychiatry

## 2024-10-25 DIAGNOSIS — F319 Bipolar disorder, unspecified: Secondary | ICD-10-CM | POA: Diagnosis not present

## 2024-10-25 DIAGNOSIS — R6250 Unspecified lack of expected normal physiological development in childhood: Secondary | ICD-10-CM | POA: Diagnosis not present

## 2024-10-25 DIAGNOSIS — E559 Vitamin D deficiency, unspecified: Secondary | ICD-10-CM

## 2024-10-25 DIAGNOSIS — G809 Cerebral palsy, unspecified: Secondary | ICD-10-CM | POA: Diagnosis not present

## 2024-10-25 DIAGNOSIS — G4721 Circadian rhythm sleep disorder, delayed sleep phase type: Secondary | ICD-10-CM

## 2024-10-25 DIAGNOSIS — I456 Pre-excitation syndrome: Secondary | ICD-10-CM

## 2024-10-25 MED ORDER — OLANZAPINE 5 MG PO TABS: 5.0000 mg | ORAL_TABLET | Freq: Every day | ORAL | 2 refills | Status: DC

## 2024-10-25 MED ORDER — DIVALPROEX SODIUM ER 500 MG PO TB24: ORAL_TABLET | ORAL | 2 refills | Status: DC

## 2024-12-13 ENCOUNTER — Ambulatory Visit (INDEPENDENT_AMBULATORY_CARE_PROVIDER_SITE_OTHER): Payer: MEDICAID | Admitting: Student in an Organized Health Care Education/Training Program

## 2024-12-13 ENCOUNTER — Encounter (HOSPITAL_COMMUNITY): Payer: Self-pay | Admitting: Student in an Organized Health Care Education/Training Program

## 2024-12-13 DIAGNOSIS — F319 Bipolar disorder, unspecified: Secondary | ICD-10-CM | POA: Diagnosis not present

## 2024-12-13 MED ORDER — OLANZAPINE 7.5 MG PO TABS: 7.5000 mg | ORAL_TABLET | Freq: Every day | ORAL | 2 refills | Status: DC

## 2024-12-13 MED ORDER — DIVALPROEX SODIUM ER 500 MG PO TB24: ORAL_TABLET | ORAL | 2 refills | Status: DC

## 2024-12-13 NOTE — Progress Notes (Signed)
 BH MD Outpatient Progress Note  12/13/2024 8:09 AM Phillip [name]  MRN:  [id number]  Assessment:  Phillip [name] presents for follow-up evaluation on 12/13/2024 .   This was my first encounter with the patient, who was accompanied by his mother, older brother, and an in-person interpreter. The patient did not report subjective concerns. However, collateral information obtained separately from family indicates a perceived worsening of symptoms following the recent reduction of Zyprexa  to 5 mg, including increased self-talk suggestive of possible internal preoccupation. Given this collateral and the goal of symptom stabilization, we agreed on a cautious increase of Zyprexa  to 7.5 mg nightly while maintaining Depakote  at the current dose.   Identifying Information: Phillip [name] is a 23 y.o. 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 Patient carries historical diagnosis of bipolar 1 disorder - per report from family patient experiences discrete mood episodes fluctuating between active states in which patient experiences marked mood lability, decreased need for sleep, impulsivity, psychomotor agitation, aggression towards self and others, and AH (although patient describes this as negative internal dialogue) and inactive states in which he sleeps excessively and appears oversedated. Most recently, he has been in an inactive state. Patient denies current symptoms concerning for depression, anxiety, or irritability and primarily notes significant daytime fatigue. He has not had any recent episodes of aggression or psychosis. It is likely that current medication regimen is contributing to fatigue and will work to consolidate medications (in particular reduce Zyprexa ) as tolerated to minimize daytime sedation.  Additionally, discussed importance of consistent sleep schedule and proper sleep hygiene with both patient and family as he demonstrates delayed sleep phase.   Plan:  # Historical diagnosis of bipolar 1 disorder Past medication trials: Invega  PO, Seroquel , Abilify  PO and Maintena, amantadine , trazodone , Ambien  Status of problem: new to me Interventions: -- Continue Depakote  ER 500 mg qAM + 1000 mg at bedtime -- Increase Zyprexa  from 5 mg to 7.5 mg nightly at families request, expressed concern for emerging psychosis/mania  # Delayed sleep phase Past medication trials: melatonin (ineffective), trazodone , Ambien  Status of problem:  new to me Interventions: -- Encouraged consistent sleep schedule -- Counseled against use of electronics prior to bedtime; partnered with patient and parents to set cutoff time of 11PM -- Can reconsider melatonin for circadian rhythm regulation; brother has declined given inefficacy in the past  # Cerebral palsy with developmental delay Status of problem: new to me Interventions: -- Followed by [facility] neurology with baseline left-sided weakness, nystagmus -- Continue day programming M-Thurs -- MRI brain 09/09/22 with no acute intracranial abnormality; demonstrated periventricular leukomalacia  # Vitamin D  deficiency Status of problem: new to me Interventions: -- Vit D 01/23/24 16.9: continue Vitamin D3 2000 IU daily (s6/24/25) -- Reviewed natural ways of supplementing Vitamin D  including exposure to sunlight and incorporating foods high yield for Vitamin D   # WPW syndrome -- Established with cardiology: most recent EKG 06/27/24 showing WPW pattern. Echo 07/05/24: EF 55-60%, structurally normal heart. Stress test and zio monitoring performed and deemed to be low risk studies. -- Patient previously managed on propranolol  however discontinued 07/03/24 per cardiology recommendation  -- Per cards: Consider alternative medication for propanolol in the setting of WPW as  this medication slows the natural conduction through the AV node which can facilitate preferential conduction through the accessory pathway thus increasing the risk  of ventricular fibrillation and sudden cardiac death.  -- AVOID any medications with high risk for Qtc prolongation or arrhythmia; on literature review, olanzapine  is felt to pose lower risk for Qtc prolongation and WPW pattern in this patient precedes use of olanzapine   # Medication monitoring Interventions: -- Zyprexa   -- Lipid profile revealing for elevated LDL (01/23/24)  -- HgbA1c wnl (01/23/24)  -- EKG 04/01/23: NSR with WPW pattern; persistent on most recent EKG 07/05/24 - established with cardiology -- Depakote :  -- CMP wnl 03/31/23  -- CBC wnl 03/31/23  -- Depakote  level 50 01/23/24 (true trough is likely lower than this level given time of draw and ER formulation)    Patient was given contact information for behavioral health clinic and was instructed to call 911 for emergencies.   Subjective:  Chief Complaint:  Chief Complaint  Patient presents with   Follow-up    Interval History:  Patient reports this was the first encounter and that he was accompanied by his mother and older brother with an in person interpreter present. Patient reports no subjective concerns at the time of the visit and states he feels his mood has improved overall. Patient reports some residual sadness but states this is much improved compared to prior and attributes this improvement to his medications. Patient reports anxiety and denies current symptoms.   Patient reports sleep has improved and states he typically goes to bed between 9 and 10 PM, falls asleep within approximately 15 minutes, and wakes around 7:30 AM feeling rested, though he describes occasional nightmares. Patient reports appetite is good and states he is eating well. Patient reports working on sleep consistency and states his mood continues to feel better. Patient reports medication  adherence and does not describe adverse effects. Patient reports no caffeine use and denies recent alcohol, tobacco, or cannabis use. Patient reports he denies suicidal ideation, homicidal ideation, and auditory or visual hallucinations.   Patient reports that collateral information from his mother and brother differs from his own account, noting that they perceive worsening symptoms following the recent reduction of Zyprexa  to 5 mg, including increased self talk suggestive of internal preoccupation and concerns that he is not sleeping well, though he himself does not share these concerns.   Visit Diagnosis:    ICD-10-CM   1. Bipolar 1 disorder (HCC)  F31.9 OLANZapine  (ZYPREXA ) 7.5 MG tablet    divalproex  (DEPAKOTE  ER) 500 MG 24 hr tablet      Past Psychiatric History:  Diagnoses: Historical diagnosis of bipolar 1 disorder; developmental delay secondary to cerebral palsy Medication trials: Invega  PO, Seroquel , Abilify  PO and Maintena, amantadine , trazodone  Hospitalizations: denies Suicide attempts: denies SIB: May bang head against wall during periods of agitation Hx of violence towards others: Has experienced aggression towards family and punching of walls during periods of agitation Substance use: denies use of etoh, illicit drugs, cannabis/CBD/THC, tobacco use Developmental: Full-term; hypoxia at delivery requiring NICU care and resulting in developmental delays and cerebral palsy  Past Medical History:  Past Medical History:  Diagnosis Date   Agitation    Bipolar 1 disorder (HCC)    Cerebral palsy (HCC)    Wolff-Parkinson-White (WPW) syndrome     Past Surgical History:  Procedure Laterality Date   STRABISMUS SURGERY Bilateral 08/08/2024   Procedure: STRABISMUS SURGERY, BILATERAL;  Surgeon: Jacques Sharper, MD;  Location: [facility];  Service: Ophthalmology;  Laterality: Bilateral;   TONSILLECTOMY      Family Psychiatric History:  Brother: depression  Family  History:  Family History  Problem Relation Age of Onset   Depression Brother     Social History:  Academic/Vocational: Attended special needs high school in [place]  Social History   Socioeconomic History   Marital status: Single    Spouse name: Not on file   Number of children: Not on file   Years of education: Not on file   Highest education level: Not on file  Occupational History   Not on file  Tobacco Use   Smoking status: Never   Smokeless tobacco: Never  Vaping Use   Vaping status: Never Used  Substance and Sexual Activity   Alcohol use: Never   Drug use: Never   Sexual activity: Not on file  Other Topics Concern   Not on file  Social History Narrative   Not on file   Social Drivers of Health   Tobacco Use: Low Risk (11/12/2024)   Received from [facility]   Patient History    Smoking Tobacco Use: Never    Smokeless Tobacco Use: Never    Passive Exposure: Never  Financial Resource Strain: Not on file  Food Insecurity: Low Risk (06/27/2024)   Received from [facility]   Epic    Within the past 12 months, you worried that your food would run out before you got money to buy more: Never true    Within the past 12 months, the food you bought just didn't last and you didn't have money to get more. : Never true  Transportation Needs: No Transportation Needs (06/27/2024)   Received from [facility]    In the past 12 months, has lack of reliable transportation kept you from medical appointments, meetings, work or from getting things needed for daily living? : No  Physical Activity: Not on file  Stress: Not on file  Social Connections: Unknown (03/28/2023)   Received from [facility]   Social Network    Social Network: Not on file  Depression (PHQ2-9): High Risk (04/04/2023)   Depression (PHQ2-9)    PHQ-2 Score: 13  Alcohol Screen: Not on file  Housing: Low Risk (06/27/2024)   Received from [facility]   Epic    What is your living situation  today?: I have a steady place to live    Think about the place you live. Do you have problems with any of the following? Choose all that apply:: None/None on this list  Utilities: Low Risk (06/27/2024)   Received from [facility]   Utilities    In the past 12 months has the electric, gas, oil, or water company threatened to shut off services in your home? : No  Health Literacy: Not on file    Allergies: No Known Allergies  Current Medications: Current Outpatient Medications  Medication Sig Dispense Refill   Cholecalciferol (VITAMIN D3) 50 MCG (2000 UT) CAPS Take 2,000 Units by mouth daily.     divalproex  (DEPAKOTE  ER) 500 MG 24 hr tablet Take 1 tablet (500 mg total) by mouth in the morning AND 2 tablets (1,000 mg total) at bedtime. 90 tablet 2   OLANZapine  (ZYPREXA ) 5 MG tablet Take 1 tablet (5 mg total) by mouth at bedtime. 30 tablet 2   tobramycin -dexamethasone  (TOBRADEX ) ophthalmic ointment Place 1 Application into the left eye 2 (two) times daily at 10 am and 4 pm. 3.5 g 0   No current facility-administered medications for this visit.    ROS: See above  Objective:  Psychiatric Specialty Exam: There  were no vitals taken for this visit.There is no height or weight on file to calculate BMI.  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); wearing glasses  Speech:  Normal Rate and requires Arabic interpreter; improved spontaneity of speech  Volume:  Normal  Mood:  good  Affect:  Euthymic, calm, pleasant  Thought Content: Denies AVH; no overt delusional thought content on interview    Suicidal Thoughts:  No  Homicidal Thoughts:  No  Thought Process: Brief however overall linear/logical  Orientation:  Full (Time, Place, and Person)    Memory: Grossly intact  Judgment:  Fair   Insight:  Fair   Concentration:  Concentration: Good  Recall: not formally assessed   Fund of Knowledge: Fair   Language: Good  Psychomotor Activity:  Very fine tremor of bilateral hands when outstretched   Akathisia:  No  AIMS (if indicated): not done  Assets:  Communication Skills Desire for Improvement Housing Leisure Time Social Support Transportation Vocational/Educational  ADL's:  Intact; requires assistance with iADLs  Cognition: Concern for mild impairment  Sleep:  improving   PE: General: sits comfortably in view of camera; no acute distress  Pulm: no increased work of breathing on room air  MSK: all extremity movements appear intact  Neuro: Horizontal nystagmus; very fine tremor of bilateral hands when outstretched Gait & Station: unable to assess by video   Metabolic Disorder Labs: Lab Results  Component Value Date   HGBA1C 4.8 01/23/2024   No results found for: PROLACTIN Lab Results  Component Value Date   CHOL 179 01/23/2024   TRIG 109 01/23/2024   HDL 48 01/23/2024   CHOLHDL 3.7 01/23/2024   LDLCALC 111 (H) 01/23/2024   No results found for: TSH  Therapeutic Level Labs: No results found for: LITHIUM Lab Results  Component Value Date   VALPROATE 50 01/23/2024   VALPROATE 15 (L) 04/01/2023   No results found for: CBMZ  Screenings:  GAD-7    Flowsheet Row Office Visit from 04/04/2023 in [facility]  Total GAD-7 Score 11   PHQ2-9    Flowsheet Row Office Visit from 04/04/2023 in [facility]  PHQ-2 Total Score 1  PHQ-9 Total Score 13   Flowsheet Row Admission (Discharged) from 08/08/2024 in [facility] ED from 05/19/2023 in [facility] ED from 03/31/2023 in [facility] Emergency Department at [facility]  C-SSRS RISK CATEGORY No Risk No Risk No Risk    Collaboration of Care: Collaboration of Care: Medication Management AEB active medication management and Psychiatrist AEB established with this provider  Patient/Guardian was advised Release of Information must  be obtained prior to any record release in order to collaborate their care with an outside provider. Patient/Guardian was advised if they have not already done so to contact the registration department to sign all necessary forms in order for us  to release information regarding their care.   Consent: Patient/Guardian gives verbal consent for treatment and assignment of benefits for services provided during this visit. Patient/Guardian expressed understanding and agreed to proceed.   A total of 30 minutes was spent involved in face to face clinical care, chart review, documentation, brief motivational interviewing, medication management, use of interpreter services, collateral from mother and father.   Marlo Masson, MD 12/13/2024, 8:09 AM

## 2024-12-13 NOTE — Patient Instructions (Signed)
 Outpatient Therapy and Psychiatry Resources for Patients: Your psychiatric needs would be well-served by consultation and regular meetings with an outpatient therapist to assist you with your mood-related conditions. Here are a series of links for finding a therapist.    Includes links to the following: [facility] [place] (http://blankenship-martinez.net/) [facility] Today Therapist Finder (https://www.psychologytoday.com/us lendell) Psychology Today Support Group Finder (https://www.psychologytoday.com/us /groups/) [employer] Location (https://carolinabehavioralcare.com/staff-location/[place]/) [facility] - Support Group Finder - (recorddebt.fi) [facility] [employer] [facility] (https://fspcares.org/contact/) [employer] [facility] [place] [facility] (https://namiguilford.org/support-and-education/support-groups/) [employer] [facility] [employer] (https://www.[place].com/lb/locations/profile/[facility]-med[facility]-at-[address]/) Dept of Health and Human Services - Find a mental health facility (http://lester.info/)

## 2024-12-14 ENCOUNTER — Encounter (HOSPITAL_COMMUNITY): Payer: MEDICAID | Admitting: Student in an Organized Health Care Education/Training Program

## 2025-01-31 ENCOUNTER — Telehealth (HOSPITAL_COMMUNITY): Payer: MEDICAID | Admitting: Student in an Organized Health Care Education/Training Program

## 2025-01-31 DIAGNOSIS — F319 Bipolar disorder, unspecified: Secondary | ICD-10-CM

## 2025-01-31 DIAGNOSIS — E559 Vitamin D deficiency, unspecified: Secondary | ICD-10-CM

## 2025-01-31 MED ORDER — DIVALPROEX SODIUM ER 500 MG PO TB24: ORAL_TABLET | ORAL | 0 refills | Status: AC

## 2025-01-31 MED ORDER — OLANZAPINE 7.5 MG PO TABS: 7.5000 mg | ORAL_TABLET | Freq: Every day | ORAL | 0 refills | Status: AC

## 2025-01-31 MED ORDER — VITAMIN D (CHOLECALCIFEROL) 50 MCG (2000 UT) PO CAPS: 2000.0000 [IU] | ORAL_CAPSULE | Freq: Every day | ORAL | 0 refills | Status: AC

## 2025-01-31 NOTE — Progress Notes (Signed)
 [facility] MD Outpatient Progress Note  01/31/2025 5:58 PM Phillip [name]  MRN:  [id number]  Virtual Visit via Video Note  I connected with Phillip [name] on 01/31/25 at  3:00 PM EST by a video enabled telemedicine application and verified that I am speaking with the correct person using two identifiers.  Location: Patient: Home Provider: Office   I discussed the limitations, risks, security and privacy concerns of performing an evaluation and management service by telephone and the availability of in person appointments. I also discussed with the patient that there may be a patient responsible charge related to this service. The patient expressed understanding and agreed to proceed.   I discussed the assessment and treatment plan with the patient. The patient was provided an opportunity to ask questions and all were answered. The patient agreed with the plan and demonstrated an understanding of the instructions.   The patient was advised to call back or seek an in-person evaluation if the symptoms worsen or if the condition fails to improve as anticipated.   Phillip Masson, MD Psych Resident, PGY-3     Assessment:  Phillip [name] presents for follow-up evaluation on 01/31/25 .   At todays follow-up, patients mood and behavioral symptoms appear stable following a recent increase in zyprexa . Patient and family report improvement in prior symptoms with no current concerns for mood lability, aggression, psychosis, or behavioral dysregulation. He denies symptoms concerning for depression, anxiety, or irritability. Overall presentation suggests adequate symptom control on the current medication regimen. At this time, no further medication changes are recommended, and the plan is to continue the current regimen with ongoing monitoring for efficacy and tolerability.  WPW syndrome is currently stable per recent cardiology evaluation. Patient is presently asymptomatic. Cardiac workup showed normal  ejection fraction and EKG, with ambulatory monitoring revealing brief, non-sustained tachycardia up to 171 bpm without wide-complex arrhythmia. No cardiac medications are indicated at this time. Patient was advised to continue usual physical activity, monitor for symptoms, and will undergo repeat ambulatory monitoring prior to cardiology follow-up in one year or sooner if symptoms recur.  Identifying Information: Phillip [name] is a 24 y.o. 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 Patient carries historical diagnosis of bipolar 1 disorder - per report from family patient experiences discrete mood episodes fluctuating between active states in which patient experiences marked mood lability, decreased need for sleep, impulsivity, psychomotor agitation, aggression towards self and others, and AH (although patient describes this as negative internal dialogue) and inactive states in which he sleeps excessively and appears oversedated. Most recently, he has been in an inactive state. Patient denies current symptoms concerning for depression, anxiety, or irritability and primarily notes significant daytime fatigue. He has not had any recent episodes of aggression or psychosis. It is likely that current medication regimen is contributing to fatigue and will work to consolidate medications (in particular reduce Zyprexa ) as tolerated to minimize daytime sedation. Additionally, discussed importance of consistent sleep schedule and proper sleep hygiene with both patient and family as he demonstrates delayed sleep phase.   Plan:  # Historical diagnosis of bipolar 1 disorder Past medication trials: Invega  PO, Seroquel , Abilify  PO and Maintena, amantadine , trazodone , Ambien  Status of problem: Stabilizing Interventions: --  Continue Depakote  ER 500 mg qAM + 1000 mg at bedtime -- Continue Zyprexa  7.5 mg nightly   # Delayed  sleep phase Past medication trials: melatonin (ineffective), trazodone , Ambien  Status of problem: Stable Interventions: -- Encouraged consistent sleep schedule   # Cerebral palsy with developmental delay Status of problem: Stable Interventions: -- Followed by [facility] neurology with baseline left-sided weakness, nystagmus -- Continue day programming M-Thurs -- MRI brain 09/09/22 with no acute intracranial abnormality; demonstrated periventricular leukomalacia  # Vitamin D  deficiency Status of problem: Stable Interventions: -- Vit D 01/23/24 16.9: continue Vitamin D3 2000 IU daily (s6/24/25) -- Plan to obtain updated vitamin D  level at next visit --Previously reviewed natural ways of supplementing Vitamin D  including exposure to sunlight and incorporating foods high yield for Vitamin D   # WPW syndrome --Recently evaluated by cardiology since last visit, see note on 01/08/2025 -- Established with cardiology: most recent EKG 06/27/24 showing WPW pattern. Echo 07/05/24: EF 55-60%, structurally normal heart. Stress test and zio monitoring performed and deemed to be low risk studies. -- Patient previously managed on propranolol  however discontinued 07/03/24 per cardiology recommendation  -- Per cards: Consider alternative medication for propanolol in the setting of WPW as this medication slows the natural conduction through the AV node which can facilitate preferential conduction through the accessory pathway thus increasing the risk of ventricular fibrillation and sudden cardiac death.  -- AVOID any medications with high risk for Qtc prolongation or arrhythmia; on literature review, olanzapine  is felt to pose lower risk for Qtc prolongation and WPW pattern in this patient precedes use of olanzapine   # Medication monitoring Interventions: -- Zyprexa   -- Lipid profile revealing for elevated LDL  (01/23/24)  -- HgbA1c wnl (01/23/24)  -- EKG 04/01/23: NSR with WPW pattern; persistent on most recent EKG 07/05/24 - established with cardiology -- Depakote :  -- CMP wnl 06/27/2024  -- CBC wnl 03/31/23  -- Depakote  level 50 01/23/24 (true trough is likely lower than this level given time of draw and ER formulation)    Patient was given contact information for behavioral health clinic and was instructed to call 911 for emergencies.   Subjective:  Chief Complaint:  Chief Complaint  Patient presents with   Follow-up    Interval History:  The patient reports he does not feel well on days he does not take his medications, noting this occurred one to two times since the last visit. He describes that his mother usually gives him his medications, though he sometimes takes them on his own. He reports stable mood and denies symptoms of depression or anxiety. He reports his sleep is good, sleeping from approximately 11 PM to 8 AM, and he denies nightmares. He reports no issues with appetite. He reports no recent substance use and denies alcohol, tobacco, or cannabis use. He reports no suicidal ideation, homicidal ideation, or auditory or visual hallucinations.  Spoke with the patient's mother separately She reports the patient's psychosis have stabilized since the Zyprexa  was increased.  No acute concerns.  Discussed strategies to prove the patient sense of autonomy   Visit Diagnosis:    ICD-10-CM   1. Vitamin D  deficiency  E55.9 Cholecalciferol  (VITAMIN D3) 50 MCG (2000 UT) CAPS    Vitamin D  1,25 dihydroxy    2. Bipolar 1 disorder (HCC)  F31.9 divalproex  (DEPAKOTE  ER) 500 MG 24 hr tablet    OLANZapine  (ZYPREXA ) 7.5 MG tablet       Past Psychiatric History:  Diagnoses: Historical diagnosis of bipolar 1 disorder; developmental delay secondary to cerebral palsy Medication trials: Invega  PO, Seroquel , Abilify  PO and Maintena, amantadine , trazodone  Hospitalizations: denies Suicide attempts:  denies SIB: May bang head against wall during periods of agitation Hx of violence towards others: Has experienced aggression towards family and punching of walls during periods of agitation Substance use: denies use of etoh, illicit drugs, cannabis/CBD/THC, tobacco use Developmental: Full-term; hypoxia at delivery requiring NICU care and resulting in developmental delays and cerebral palsy  Past Medical History:  Past Medical History:  Diagnosis Date   Agitation    Bipolar 1 disorder (HCC)    Cerebral palsy (HCC)    Wolff-Parkinson-White (WPW) syndrome     Past Surgical History:  Procedure Laterality Date   STRABISMUS SURGERY Bilateral 08/08/2024   Procedure: STRABISMUS SURGERY, BILATERAL;  Surgeon: Jacques Sharper, MD;  Location: [facility];  Service: Ophthalmology;  Laterality: Bilateral;   TONSILLECTOMY      Family Psychiatric History:  Brother: depression  Family History:  Family History  Problem Relation Age of Onset   Depression Brother     Social History:  Academic/Vocational: Attended special needs high school in [place]  Social History   Socioeconomic History   Marital status: Single    Spouse name: Not on file   Number of children: Not on file   Years of education: Not on file   Highest education level: Not on file  Occupational History   Not on file  Tobacco Use   Smoking status: Never   Smokeless tobacco: Never  Vaping Use   Vaping status: Never Used  Substance and Sexual Activity   Alcohol use: Never   Drug use: Never   Sexual activity: Not on file  Other Topics Concern   Not on file  Social History Narrative   Not on file   Social Drivers of Health   Tobacco Use: Low Risk (01/08/2025)   Received from [facility]   Patient History    Smoking Tobacco Use: Never    Smokeless Tobacco Use: Never    Passive Exposure: Never  Financial Resource Strain: Not on file  Food Insecurity: Low Risk (01/08/2025)   Received from [facility]   Epic    Within the past 12 months, you worried that your food would run out before you got money to buy more: Never true    Within the past 12 months, the food you bought just didn't last and you didn't have money to get more. : Never true  Transportation Needs: No Transportation Needs (06/27/2024)   Received from [facility]    In the past 12 months, has lack of reliable transportation kept you from medical appointments, meetings, work or from getting things needed for daily living? : No  Physical Activity: Not on file  Stress: Not on file  Social Connections: Unknown (03/28/2023)   Received from [facility]   Social Network    Social Network: Not on file  Depression (PHQ2-9): High Risk (04/04/2023)   Depression (PHQ2-9)    PHQ-2 Score: 13  Alcohol Screen: Not on file  Housing: Low Risk (01/08/2025)   Received from [facility]   Epic    What is your living situation today?: I have a steady place to live    Think about the place you live. Do you have problems with any of the following? Choose all that apply:: None/None on this list  Utilities: Low Risk (06/27/2024)   Received from [facility]   Utilities    In the past 12 months has the electric, gas, oil, or water company threatened to shut off services in your  home? : No  Health Literacy: Not on file    Allergies: No Known Allergies  Current Medications: Current Outpatient Medications  Medication Sig Dispense Refill   Cholecalciferol  (VITAMIN D3) 50 MCG (2000 UT) CAPS Take 1 capsule (2,000 Units total) by mouth daily. 90 capsule 0   divalproex  (DEPAKOTE  ER) 500 MG 24 hr tablet Take 1 tablet (500 mg total) by mouth in the morning AND 2 tablets (1,000 mg total) at bedtime. 270 tablet 0   OLANZapine  (ZYPREXA ) 7.5 MG tablet Take 1 tablet (7.5 mg total) by mouth at bedtime. 90 tablet 0   tobramycin -dexamethasone  (TOBRADEX ) ophthalmic ointment Place 1 Application into the left eye 2 (two) times daily at 10  am and 4 pm. 3.5 g 0   No current facility-administered medications for this visit.    ROS: See above  Objective:  Psychiatric Specialty Exam: There were no vitals taken for this visit.There is no height or weight on file to calculate BMI.  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); wearing glasses  Speech:  Normal Rate and requires Arabic interpreter; improved spontaneity of speech  Volume:  Normal  Mood:  Good  Affect:  Euthymic, calm, pleasant  Thought Content: Denies AVH; no overt delusional thought content on interview    Suicidal Thoughts:  No  Homicidal Thoughts:  No  Thought Process: Brief however overall linear/logical  Orientation:  Full (Time, Place, and Person)    Memory: Grossly intact  Judgment:  Fair   Insight:  Fair   Concentration:  Concentration: Good  Recall: not formally assessed   Fund of Knowledge: Fair  Language: Good  Psychomotor Activity: Limited on video virtual visit  Akathisia:  No  AIMS (if indicated): not done  Assets:  Communication Skills Desire for Improvement Housing Leisure Time Social Support Transportation Vocational/Educational  ADL's:  Intact; requires assistance with iADLs  Cognition: Concern for mild impairment  Sleep:  improving   PE: General: sits comfortably in view of camera; no acute distress  Pulm: no increased work of breathing on room air  MSK: all extremity movements appear intact  Neuro: Horizontal nystagmus; very fine tremor of bilateral hands when outstretched Gait & Station: unable to assess by video   Metabolic Disorder Labs: Lab Results  Component Value Date   HGBA1C 4.8 01/23/2024   No results found for: PROLACTIN Lab Results  Component Value Date   CHOL 179 01/23/2024   TRIG 109 01/23/2024   HDL 48 01/23/2024   CHOLHDL 3.7 01/23/2024   LDLCALC 111 (H) 01/23/2024   No results found for: TSH  Therapeutic Level  Labs: No results found for: LITHIUM Lab Results  Component Value Date   VALPROATE 50 01/23/2024   VALPROATE 15 (L) 04/01/2023   No results found for: CBMZ  Screenings:  GAD-7    Flowsheet Row Office Visit from 04/04/2023 in [facility]  Total GAD-7 Score 11   PHQ2-9    Flowsheet Row Office Visit from 04/04/2023 in [place]  PHQ-2 Total Score 1  PHQ-9 Total Score 13   Flowsheet Row Admission (Discharged) from 08/08/2024 in [facility] from 05/19/2023 in [facility] ED from 03/31/2023 in [facility] Emergency Department at [facility]  C-SSRS RISK CATEGORY No Risk No Risk No Risk    Collaboration of Care: Collaboration of Care: Medication Management AEB active medication management and Psychiatrist AEB established with this  provider  Patient/Guardian was advised Release of Information must be obtained prior to any record release in order to collaborate their care with an outside provider. Patient/Guardian was advised if they have not already done so to contact the registration department to sign all necessary forms in order for us  to release information regarding their care.   Consent: Patient/Guardian gives verbal consent for treatment and assignment of benefits for services provided during this visit. Patient/Guardian expressed understanding and agreed to proceed.   A total of 30 minutes was spent involved in face to face clinical care, chart review, documentation, brief motivational interviewing, medication management, use of interpreter services, collateral from mother and father.   Phillip Masson, MD 01/31/2025, 5:58 PM

## 2025-03-28 ENCOUNTER — Telehealth (HOSPITAL_COMMUNITY): Payer: MEDICAID | Admitting: Student in an Organized Health Care Education/Training Program
